# Patient Record
Sex: Female | Born: 1995
Health system: Southern US, Community
[De-identification: ages and names within clinical notes are randomized; demographics above are authoritative.]

---

## 2018-04-01 ENCOUNTER — Ambulatory Visit (INDEPENDENT_AMBULATORY_CARE_PROVIDER_SITE_OTHER): Payer: 59 | Admitting: Family Medicine

## 2018-04-01 ENCOUNTER — Encounter: Payer: Self-pay | Admitting: Family Medicine

## 2018-04-01 VITALS — BP 110/74 | HR 67 | Temp 97.8°F | Ht 60.0 in | Wt 151.2 lb

## 2018-04-01 DIAGNOSIS — Z114 Encounter for screening for human immunodeficiency virus [HIV]: Secondary | ICD-10-CM | POA: Diagnosis not present

## 2018-04-01 DIAGNOSIS — Z0000 Encounter for general adult medical examination without abnormal findings: Secondary | ICD-10-CM

## 2018-04-01 LAB — CBC WITH DIFFERENTIAL/PLATELET
BASOS PCT: 0.5 % (ref 0.0–3.0)
Basophils Absolute: 0 10*3/uL (ref 0.0–0.1)
Eosinophils Absolute: 0.6 10*3/uL (ref 0.0–0.7)
Eosinophils Relative: 6.8 % — ABNORMAL HIGH (ref 0.0–5.0)
HCT: 41.5 % (ref 36.0–46.0)
Hemoglobin: 13.6 g/dL (ref 12.0–15.0)
LYMPHS ABS: 2.5 10*3/uL (ref 0.7–4.0)
Lymphocytes Relative: 26.2 % (ref 12.0–46.0)
MCHC: 32.8 g/dL (ref 30.0–36.0)
MCV: 88.3 fl (ref 78.0–100.0)
MONO ABS: 0.6 10*3/uL (ref 0.1–1.0)
Monocytes Relative: 6.1 % (ref 3.0–12.0)
NEUTROS PCT: 60.4 % (ref 43.0–77.0)
Neutro Abs: 5.6 10*3/uL (ref 1.4–7.7)
PLATELETS: 379 10*3/uL (ref 150.0–400.0)
RBC: 4.7 Mil/uL (ref 3.87–5.11)
RDW: 14.3 % (ref 11.5–15.5)
WBC: 9.3 10*3/uL (ref 4.0–10.5)

## 2018-04-01 LAB — LIPID PANEL
CHOLESTEROL: 178 mg/dL (ref 0–200)
HDL: 35.3 mg/dL — ABNORMAL LOW (ref >39.00)
LDL Cholesterol: 123 mg/dL — ABNORMAL HIGH (ref 0–99)
NonHDL: 143.16
Total CHOL/HDL Ratio: 5
Triglycerides: 103 mg/dL (ref 0.0–149.0)
VLDL: 20.6 mg/dL (ref 0.0–40.0)

## 2018-04-01 LAB — COMPREHENSIVE METABOLIC PANEL
ALK PHOS: 71 U/L (ref 39–117)
ALT: 18 U/L (ref 0–35)
AST: 17 U/L (ref 0–37)
Albumin: 4.4 g/dL (ref 3.5–5.2)
BUN: 10 mg/dL (ref 6–23)
CO2: 29 mEq/L (ref 19–32)
Calcium: 9.3 mg/dL (ref 8.4–10.5)
Chloride: 103 mEq/L (ref 96–112)
Creatinine, Ser: 0.66 mg/dL (ref 0.40–1.20)
GFR: 118.09 mL/min (ref >60.00)
GLUCOSE: 95 mg/dL (ref 70–99)
POTASSIUM: 4.1 meq/L (ref 3.5–5.1)
SODIUM: 140 meq/L (ref 135–145)
TOTAL PROTEIN: 7.2 g/dL (ref 6.0–8.3)
Total Bilirubin: 0.7 mg/dL (ref 0.2–1.2)

## 2018-04-01 LAB — TSH: TSH: 0.87 u[IU]/mL (ref 0.35–4.50)

## 2018-04-01 NOTE — Progress Notes (Signed)
Patient: Katherine Morgan MRN: [id number] DOB: 02-Jun-1996 PCP: Orland Mustard, MD   I acted as a scribe for Dr. Yetta Glassman, LPN  Subjective:  Chief Complaint  Patient presents with  . Establish Care  . Annual Exam    Fasting    HPI: The patient is a 22 y.o. female who presents today for annual exam. She denies any changes to past medical history. There have been no recent hospitalizations. They are following a well balanced diet and exercise plan. Weight has been stable. No complaints today. 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   Immunization History  Administered Date(s) Administered  . Tdap 05/24/2013   Pap smear: n/a. Never had sex.   Review of Systems  Constitutional: Negative for appetite change, chills, fatigue and fever.  HENT: Negative for sinus pain and sore throat.   Eyes: Negative for visual disturbance.  Respiratory: Negative for cough, shortness of breath and wheezing.   Cardiovascular: Negative.   Gastrointestinal: Negative.   Endocrine: Negative for cold intolerance, heat intolerance and polydipsia.  Genitourinary: Negative for decreased urine volume, difficulty urinating, dysuria, flank pain, frequency, genital sores, pelvic pain, urgency, vaginal bleeding, vaginal discharge and vaginal pain.  Musculoskeletal: Negative.   Skin: Negative for rash.  Neurological: Negative for dizziness, tremors, seizures, speech difficulty, weakness, light-headedness, numbness and headaches.  Hematological: Does not bruise/bleed easily.  Psychiatric/Behavioral: Negative.  Negative for agitation, confusion, decreased concentration, hallucinations, sleep disturbance and suicidal ideas. The patient is not nervous/anxious.     Allergies Patient has No Known Allergies.  Past Medical History Patient  has no past medical history on file.  Surgical History Patient  has no past surgical history on file.  Family History Pateint's  family history includes Arthritis in her maternal grandmother and mother; Diabetes in her father and maternal grandfather; Early death in her paternal grandfather; Heart attack in her maternal grandfather; Hyperlipidemia in her father; Hypertension in her maternal grandmother; Seizures in her maternal grandfather; Stroke in her maternal grandfather.  Social History Patient  reports that she has never smoked. She has never used smokeless tobacco. She reports that she drinks alcohol. She reports that she does not use drugs.    Objective: Vitals:   04/01/18 1016  BP: 110/74  Pulse: 67  Temp: 97.8 F (36.6 C)  TempSrc: Oral  SpO2: 97%  Weight: 151 lb 4 oz (68.6 kg)  Height: 5' (1.524 m)    Body mass index is 29.54 kg/m.  Physical Exam  Constitutional: She is oriented to person, place, and time. She appears well-developed and well-nourished.  HENT:  Right Ear: External ear normal.  Left Ear: External ear normal.  Mouth/Throat: Oropharynx is clear and moist.  Eyes: Pupils are equal, round, and reactive to light. Conjunctivae and EOM are normal.  Neck: Normal range of motion. Neck supple. No thyromegaly present.  Cardiovascular: Normal rate, regular rhythm, normal heart sounds and intact distal pulses.  No murmur heard. Pulmonary/Chest: Effort normal and breath sounds normal.  Abdominal: Soft. Bowel sounds are normal. She exhibits no distension. There is no tenderness.  Lymphadenopathy:    She has no cervical adenopathy.  Neurological: She is alert and oriented to person, place, and time. She displays normal reflexes. No cranial nerve deficit. Coordination normal.  Skin: Skin is warm and dry. No rash noted.  Psychiatric: She has a normal mood and affect. Her behavior is normal.  Vitals reviewed.      Assessment/plan: 1.  Annual physical exam She is very healthy and doing great. Continue healthy lifestyle. Continue to follow with dentist. Sunscreen and safety discussed. discussed  counseling below. F/u in one year or as needed. Encouraged to work on weight.  Patient counseling [x]    Nutrition: Stressed importance of moderation in sodium/caffeine intake, saturated fat and cholesterol, caloric balance, sufficient intake of fresh fruits, vegetables, fiber, calcium, iron, and 1 mg of folate supplement per day (for females capable of pregnancy).  [x]    Stressed the importance of regular exercise.   [x]    Substance Abuse: Discussed cessation/primary prevention of tobacco, alcohol, or other drug use; driving or other dangerous activities under the influence; availability of treatment for abuse.   [x]    Injury prevention: Discussed safety belts, safety helmets, smoke detector, smoking near bedding or upholstery.   [x]    Sexuality: Discussed sexually transmitted diseases, partner selection, use of condoms, avoidance of unintended pregnancy  and contraceptive alternatives.  [x]    Dental health: Discussed importance of regular tooth brushing, flossing, and dental visits.  [x]    Health maintenance and immunizations reviewed. Please refer to Health maintenance section.    - Comprehensive metabolic panel - CBC with Differential/Platelet - Lipid panel - TSH  2. Encounter for screening for HIV  - HIV antibody     Return in about 1 year (around 04/02/2019).     Orland Mustard, MD [facility]  04/01/2018

## 2018-04-02 LAB — HIV ANTIBODY (ROUTINE TESTING W REFLEX): HIV: NONREACTIVE

## 2018-04-24 ENCOUNTER — Encounter: Payer: Self-pay | Admitting: Family Medicine

## 2018-04-24 NOTE — Progress Notes (Signed)
Called and left detailed voicemail msg on pt's cell phone.  Advised to call back and schedule appt w/Dr. Artis FlockWolfe (pref Monday, 7/8) to eyeball wound on knee and update tetanus booster at that time.  Advised [facility] can schedule appt for pt.  CRM created

## 2018-04-27 ENCOUNTER — Encounter: Payer: Self-pay | Admitting: Family Medicine

## 2018-04-27 ENCOUNTER — Ambulatory Visit (INDEPENDENT_AMBULATORY_CARE_PROVIDER_SITE_OTHER): Payer: 59 | Admitting: Family Medicine

## 2018-04-27 VITALS — BP 116/82 | HR 70 | Temp 97.9°F | Wt 150.4 lb

## 2018-04-27 DIAGNOSIS — S81019A Laceration without foreign body, unspecified knee, initial encounter: Secondary | ICD-10-CM | POA: Diagnosis not present

## 2018-04-27 DIAGNOSIS — Z23 Encounter for immunization: Secondary | ICD-10-CM

## 2018-04-27 MED ORDER — MUPIROCIN 2 % EX OINT: TOPICAL_OINTMENT | CUTANEOUS | 0 refills | Status: DC

## 2018-04-27 NOTE — Progress Notes (Signed)
Patient: Katherine Morgan MRN: [id number] DOB: 1996-04-08 PCP: Orland MustardWolfe, Evo Aderman, MD     Subjective:  Chief Complaint  Patient presents with   Wound Check    right knee injury 6/22    HPI: The patient is a 22 y.o. female who presents today for right knee laceration that happened on the 6/22. She had taken her glasses on and fell in a pot hole. She dug a rock out on 7/4. She took a shower afterwards, but didn't really flush excessively. It is doing much better. It is healing okay, but she would like me to look at it. No fever/chills, no drainage. No spreading redness and no swelling. She has no pain. She is 5 years into her tetanus shot. I would like to update this.   Review of Systems  Constitutional: Negative for fever.  Musculoskeletal: Negative for arthralgias.  Skin: Positive for wound.    Allergies Patient has No Known Allergies.  Past Medical History Patient  has no past medical history on file.  Surgical History Patient  has no past surgical history on file.  Family History Pateint's family history includes Arthritis in her maternal grandmother and mother; Diabetes in her father and maternal grandfather; Early death in her paternal grandfather; Heart attack in her maternal grandfather; Hyperlipidemia in her father; Hypertension in her maternal grandmother; Seizures in her maternal grandfather; Stroke in her maternal grandfather.  Social History Patient  reports that she has been smoking cigarettes.  She has never used smokeless tobacco. She reports that she drinks alcohol. She reports that she does not use drugs.    Objective: Vitals:   04/27/18 1105  BP: 116/82  Pulse: 70  Temp: 97.9 F (36.6 C)  TempSrc: Oral  SpO2: 99%  Weight: 150 lb 6.4 oz (68.2 kg)    Body mass index is 29.37 kg/m.  Physical Exam  Constitutional: She appears well-developed and well-nourished.  Musculoskeletal: Normal range of motion. She exhibits no edema, tenderness or deformity.   Skin:  Right knee: <605mm open wound on right knee. Healing well. No erythema, edema or discharge.   Vitals reviewed.      Assessment/plan: 1. Laceration of knee, unspecified laterality, initial encounter Close to 5 years on her tdap so will update this. Knee much improved since removing rock. Almost closed. Continue to flush and clean well. bactroban TID. Let me know if any changes.  - Tdap vaccine greater than or equal to 7yo IM     Return if symptoms worsen or fail to improve.   Orland MustardAllison Kynedi Profitt, MD [facility]   04/27/2018

## 2018-05-09 DIAGNOSIS — H5213 Myopia, bilateral: Secondary | ICD-10-CM | POA: Diagnosis not present

## 2019-04-13 DIAGNOSIS — M9901 Segmental and somatic dysfunction of cervical region: Secondary | ICD-10-CM | POA: Diagnosis not present

## 2019-04-13 DIAGNOSIS — M546 Pain in thoracic spine: Secondary | ICD-10-CM | POA: Diagnosis not present

## 2019-04-13 DIAGNOSIS — M9902 Segmental and somatic dysfunction of thoracic region: Secondary | ICD-10-CM | POA: Diagnosis not present

## 2019-04-13 DIAGNOSIS — M542 Cervicalgia: Secondary | ICD-10-CM | POA: Diagnosis not present

## 2019-04-15 DIAGNOSIS — M9902 Segmental and somatic dysfunction of thoracic region: Secondary | ICD-10-CM | POA: Diagnosis not present

## 2019-04-15 DIAGNOSIS — M9901 Segmental and somatic dysfunction of cervical region: Secondary | ICD-10-CM | POA: Diagnosis not present

## 2019-04-15 DIAGNOSIS — M546 Pain in thoracic spine: Secondary | ICD-10-CM | POA: Diagnosis not present

## 2019-04-15 DIAGNOSIS — M542 Cervicalgia: Secondary | ICD-10-CM | POA: Diagnosis not present

## 2019-04-22 DIAGNOSIS — M9901 Segmental and somatic dysfunction of cervical region: Secondary | ICD-10-CM | POA: Diagnosis not present

## 2019-04-22 DIAGNOSIS — M9902 Segmental and somatic dysfunction of thoracic region: Secondary | ICD-10-CM | POA: Diagnosis not present

## 2019-04-22 DIAGNOSIS — M546 Pain in thoracic spine: Secondary | ICD-10-CM | POA: Diagnosis not present

## 2019-04-22 DIAGNOSIS — M542 Cervicalgia: Secondary | ICD-10-CM | POA: Diagnosis not present

## 2019-04-28 DIAGNOSIS — M9901 Segmental and somatic dysfunction of cervical region: Secondary | ICD-10-CM | POA: Diagnosis not present

## 2019-04-28 DIAGNOSIS — M542 Cervicalgia: Secondary | ICD-10-CM | POA: Diagnosis not present

## 2019-04-28 DIAGNOSIS — M9902 Segmental and somatic dysfunction of thoracic region: Secondary | ICD-10-CM | POA: Diagnosis not present

## 2019-04-28 DIAGNOSIS — M546 Pain in thoracic spine: Secondary | ICD-10-CM | POA: Diagnosis not present

## 2020-01-13 ENCOUNTER — Ambulatory Visit (INDEPENDENT_AMBULATORY_CARE_PROVIDER_SITE_OTHER): Payer: No Typology Code available for payment source | Admitting: Family Medicine

## 2020-01-13 ENCOUNTER — Encounter: Payer: Self-pay | Admitting: Family Medicine

## 2020-01-13 ENCOUNTER — Other Ambulatory Visit: Payer: Self-pay

## 2020-01-13 VITALS — BP 100/60 | HR 68 | Temp 98.0°F | Ht 60.0 in | Wt 153.0 lb

## 2020-01-13 DIAGNOSIS — Z0000 Encounter for general adult medical examination without abnormal findings: Secondary | ICD-10-CM | POA: Diagnosis not present

## 2020-01-13 DIAGNOSIS — Z114 Encounter for screening for human immunodeficiency virus [HIV]: Secondary | ICD-10-CM

## 2020-01-13 LAB — COMPREHENSIVE METABOLIC PANEL
ALT: 13 U/L (ref 0–35)
AST: 16 U/L (ref 0–37)
Albumin: 4.7 g/dL (ref 3.5–5.2)
Alkaline Phosphatase: 65 U/L (ref 39–117)
BUN: 13 mg/dL (ref 6–23)
CO2: 28 mEq/L (ref 19–32)
Calcium: 9.7 mg/dL (ref 8.4–10.5)
Chloride: 102 mEq/L (ref 96–112)
Creatinine, Ser: 0.67 mg/dL (ref 0.40–1.20)
GFR: 107.54 mL/min (ref >60.00)
Glucose, Bld: 99 mg/dL (ref 70–99)
Potassium: 4.4 mEq/L (ref 3.5–5.1)
Sodium: 138 mEq/L (ref 135–145)
Total Bilirubin: 0.9 mg/dL (ref 0.2–1.2)
Total Protein: 7.4 g/dL (ref 6.0–8.3)

## 2020-01-13 LAB — CBC WITH DIFFERENTIAL/PLATELET
Basophils Absolute: 0.1 10*3/uL (ref 0.0–0.1)
Basophils Relative: 0.8 % (ref 0.0–3.0)
Eosinophils Absolute: 0.2 10*3/uL (ref 0.0–0.7)
Eosinophils Relative: 2.7 % (ref 0.0–5.0)
HCT: 42.4 % (ref 36.0–46.0)
Hemoglobin: 14.2 g/dL (ref 12.0–15.0)
Lymphocytes Relative: 28.1 % (ref 12.0–46.0)
Lymphs Abs: 2.4 10*3/uL (ref 0.7–4.0)
MCHC: 33.4 g/dL (ref 30.0–36.0)
MCV: 89.4 fl (ref 78.0–100.0)
Monocytes Absolute: 0.5 10*3/uL (ref 0.1–1.0)
Monocytes Relative: 5.5 % (ref 3.0–12.0)
Neutro Abs: 5.3 10*3/uL (ref 1.4–7.7)
Neutrophils Relative %: 62.9 % (ref 43.0–77.0)
Platelets: 373 10*3/uL (ref 150.0–400.0)
RBC: 4.74 Mil/uL (ref 3.87–5.11)
RDW: 13.9 % (ref 11.5–15.5)
WBC: 8.5 10*3/uL (ref 4.0–10.5)

## 2020-01-13 LAB — LIPID PANEL
Cholesterol: 176 mg/dL (ref 0–200)
HDL: 40.4 mg/dL (ref >39.00)
LDL Cholesterol: 120 mg/dL — ABNORMAL HIGH (ref 0–99)
NonHDL: 135.59
Total CHOL/HDL Ratio: 4
Triglycerides: 77 mg/dL (ref 0.0–149.0)
VLDL: 15.4 mg/dL (ref 0.0–40.0)

## 2020-01-13 LAB — VITAMIN D 25 HYDROXY (VIT D DEFICIENCY, FRACTURES): VITD: 25.25 ng/mL — ABNORMAL LOW (ref 30.00–100.00)

## 2020-01-13 LAB — TSH: TSH: 1.06 u[IU]/mL (ref 0.35–4.50)

## 2020-01-13 NOTE — Patient Instructions (Signed)
Preventive Care 25-25 Years Old, Female Preventive care refers to lifestyle choices and visits with your health care provider that can promote health and wellness. At this stage in your life, you may start seeing a primary care physician instead of a pediatrician. Your health care is now your responsibility. Preventive care for young adults includes:  A yearly physical exam. This is also called an annual wellness visit.  Regular dental and eye exams.  Immunizations.  Screening for certain conditions.  Healthy lifestyle choices, such as diet and exercise. What can I expect for my preventive care visit? Physical exam Your health care provider may check:  Height and weight. These may be used to calculate body mass index (BMI), which is a measurement that tells if you are at a healthy weight.  Heart rate and blood pressure.  Body temperature. Counseling Your health care provider may ask you questions about:  Past medical problems and family medical history.  Alcohol, tobacco, and drug use.  Home and relationship well-being.  Access to firearms.  Emotional well-being.  Diet, exercise, and sleep habits.  Sexual activity and sexual health.  Method of birth control.  Menstrual cycle.  Pregnancy history. What immunizations do I need?  Influenza (flu) vaccine  This is recommended every year. Tetanus, diphtheria, and pertussis (Tdap) vaccine  You may need a Td booster every 10 years. Varicella (chickenpox) vaccine  You may need this vaccine if you have not already been vaccinated. Human papillomavirus (HPV) vaccine  If recommended by your health care provider, you may need three doses over 6 months. Measles, mumps, and rubella (MMR) vaccine  You may need at least one dose of MMR. You may also need a second dose. Meningococcal conjugate (MenACWY) vaccine  One dose is recommended if you are 25-25 years old and a first-year college student living in a residence hall,  or if you have one of several medical conditions. You may also need additional booster doses. Pneumococcal conjugate (PCV13) vaccine  You may need this if you have certain conditions and were not previously vaccinated. Pneumococcal polysaccharide (PPSV23) vaccine  You may need one or two doses if you smoke cigarettes or if you have certain conditions. Hepatitis A vaccine  You may need this if you have certain conditions or if you travel or work in places where you may be exposed to hepatitis A. Hepatitis B vaccine  You may need this if you have certain conditions or if you travel or work in places where you may be exposed to hepatitis B. Haemophilus influenzae type b (Hib) vaccine  You may need this if you have certain risk factors. You may receive vaccines as individual doses or as more than one vaccine together in one shot (combination vaccines). Talk with your health care provider about the risks and benefits of combination vaccines. What tests do I need? Blood tests  Lipid and cholesterol levels. These may be checked every 5 years starting at age 25.  Hepatitis C test.  Hepatitis B test. Screening  Pelvic exam and Pap test. This may be done every 3 years starting at age 25.  Sexually transmitted disease (STD) testing, if you are at risk.  BRCA-related cancer screening. This may be done if you have a family history of breast, ovarian, tubal, or peritoneal cancers. Other tests  Tuberculosis skin test.  Vision and hearing tests.  Skin exam.  Breast exam. Follow these instructions at home: Eating and drinking   Eat a diet that includes fresh fruits and   vegetables, whole grains, lean protein, and low-fat dairy products.  Drink enough fluid to keep your urine pale yellow.  Do not drink alcohol if: ? Your health care provider tells you not to drink. ? You are pregnant, may be pregnant, or are planning to become pregnant. ? You are under the legal drinking age. In the  [place] the legal drinking age is 36.  If you drink alcohol: ? Limit how much you have to 0-1 drink a day. ? Be aware of how much alcohol is in your drink. In the [place] one drink equals one 12 oz bottle of beer (355 mL), one 5 oz glass of wine (148 mL), or one 1 oz glass of hard liquor (44 mL). Lifestyle  Take daily care of your teeth and gums.  Stay active. Exercise at least 30 minutes 5 or more days of the week.  Do not use any products that contain nicotine or tobacco, such as cigarettes, e-cigarettes, and chewing tobacco. If you need help quitting, ask your health care provider.  Do not use drugs.  If you are sexually active, practice safe sex. Use a condom or other form of birth control (contraception) in order to prevent pregnancy and STIs (sexually transmitted infections). If you plan to become pregnant, see your health care provider for a pre-conception visit.  Find healthy ways to cope with stress, such as: ? Meditation, yoga, or listening to music. ? Journaling. ? Talking to a trusted person. ? Spending time with friends and family. Safety  Always wear your seat belt while driving or riding in a vehicle.  Do not drive if you have been drinking alcohol. Do not ride with someone who has been drinking.  Do not drive when you are tired or distracted. Do not text while driving.  Wear a helmet and other protective equipment during sports activities.  If you have firearms in your house, make sure you follow all gun safety procedures.  Seek help if you have been bullied, physically abused, or sexually abused.  Use the Internet responsibly to avoid dangers such as online bullying and online sex predators. What's next?  Go to your health care provider once a year for a well check visit.  Ask your health care provider how often you should have your eyes and teeth checked.  Stay up to date on all vaccines. This information is not intended to replace advice given to you by  your health care provider. Make sure you discuss any questions you have with your health care provider. Document Revised: 10/01/2018 Document Reviewed: 10/01/2018 Elsevier Patient Education  2020 [employer].

## 2020-01-13 NOTE — Progress Notes (Signed)
Patient: Katherine Morgan MRN: [id number] DOB: 13-Dec-1995 PCP: Orma Flaming, MD     Subjective:  Chief Complaint  Patient presents with  . Annual Exam    HPI: The patient is a 24 y.o. female who presents today for annual exam. She denies any changes to past medical history. There have been no recent hospitalizations. They are following a well balanced diet and exercise plan. Weight has been stable. No complaints today.   Immunization History  Administered Date(s) Administered  . DTaP 07/11/1995, 10/03/1995, 11/21/1995  . Hepatitis B 04-10-1995, 07/11/1995, 05/21/1996  . HiB (PRP-OMP) 07/11/1995, 10/03/1995, 11/21/1995  . IPV 07/11/1995, 10/03/1995, 11/21/1995  . Influenza, Quadrivalent, Recombinant, Inj, Pf 07/22/2019  . MMR 05/21/1996  . PFIZER SARS-COV-2 Vaccination 11/15/2019, 12/10/2019  . Tdap 05/24/2013, 04/27/2018  . Varicella 05/21/1996   Colonoscopy: routine screening.  Mammogram: routine screening  Pap smear: n/a. Never had sex.   Review of Systems  Constitutional: Negative for chills, fatigue and fever.  HENT: Negative for dental problem, ear pain, hearing loss, sinus pressure, sinus pain, sore throat and trouble swallowing.   Eyes: Negative for visual disturbance.  Respiratory: Negative for cough, chest tightness, shortness of breath and wheezing.   Cardiovascular: Negative for chest pain, palpitations and leg swelling.  Gastrointestinal: Negative for abdominal pain, blood in stool, diarrhea, nausea and vomiting.  Endocrine: Negative for cold intolerance, polydipsia, polyphagia and polyuria.  Genitourinary: Negative for dysuria, frequency, hematuria, pelvic pain and urgency.  Musculoskeletal: Negative for arthralgias.  Skin: Negative for rash.  Neurological: Negative for dizziness and headaches.  Psychiatric/Behavioral: Negative for dysphoric mood and sleep disturbance. The patient is not nervous/anxious.     Allergies Patient has No Known  Allergies.  Past Medical History Patient  has no past medical history on file.  Surgical History Patient  has no past surgical history on file.  Family History Pateint's family history includes Arthritis in her maternal grandmother and mother; Diabetes in her father and maternal grandfather; Early death in her paternal grandfather; Heart attack in her maternal grandfather; Hyperlipidemia in her father; Hypertension in her maternal grandmother; Seizures in her maternal grandfather; Stroke in her maternal grandfather.  Social History Patient  reports that she has been smoking cigarettes. She has never used smokeless tobacco. She reports current alcohol use. She reports that she does not use drugs.    Objective: Vitals:   01/13/20 1037  BP: 100/60  Pulse: 68  Temp: 98 F (36.7 C)  TempSrc: Temporal  SpO2: 99%  Weight: 153 lb (69.4 kg)  Height: 5' (1.524 m)    Body mass index is 29.88 kg/m.  Physical Exam Vitals reviewed.  Constitutional:      Appearance: Normal appearance. She is well-developed.  HENT:     Head: Normocephalic and atraumatic.     Right Ear: Tympanic membrane, ear canal and external ear normal.     Left Ear: Tympanic membrane, ear canal and external ear normal.     Mouth/Throat:     Mouth: Mucous membranes are moist.  Eyes:     Extraocular Movements: Extraocular movements intact.     Conjunctiva/sclera: Conjunctivae normal.     Pupils: Pupils are equal, round, and reactive to light.  Neck:     Thyroid: No thyromegaly.  Cardiovascular:     Rate and Rhythm: Normal rate and regular rhythm.     Pulses: Normal pulses.     Heart sounds: Normal heart sounds. No murmur.  Pulmonary:     Effort: Pulmonary effort is normal.  Breath sounds: Normal breath sounds.  Abdominal:     General: Bowel sounds are normal. There is no distension.     Palpations: Abdomen is soft.     Tenderness: There is no abdominal tenderness.  Musculoskeletal:     Cervical back:  Normal range of motion and neck supple.  Lymphadenopathy:     Cervical: No cervical adenopathy.  Skin:    General: Skin is warm and dry.     Capillary Refill: Capillary refill takes less than 2 seconds.     Findings: No rash.     Comments: Acanthosis nigricans around posterior and lateral neck   Neurological:     General: No focal deficit present.     Mental Status: She is alert and oriented to person, place, and time.     Cranial Nerves: No cranial nerve deficit.     Coordination: Coordination normal.     Deep Tendon Reflexes: Reflexes normal.  Psychiatric:        Mood and Affect: Mood normal.        Behavior: Behavior normal.      Office Visit from 04/01/2018 in [facility]  PHQ-2 Total Score  0     Phq9:score of 1     Assessment/plan: 1. Annual physical exam Fasting labs today. Doing well. Encouraged some weight loss. utd on HM. F/u in one year or as needed.  Patient counseling '[x]'$    Nutrition: Stressed importance of moderation in sodium/caffeine intake, saturated fat and cholesterol, caloric balance, sufficient intake of fresh fruits, vegetables, fiber, calcium, iron, and 1 mg of folate supplement per day (for females capable of pregnancy).  '[x]'$    Stressed the importance of regular exercise.   '[]'$    Substance Abuse: Discussed cessation/primary prevention of tobacco, alcohol, or other drug use; driving or other dangerous activities under the influence; availability of treatment for abuse.   '[x]'$    Injury prevention: Discussed safety belts, safety helmets, smoke detector, smoking near bedding or upholstery.   '[x]'$    Sexuality: Discussed sexually transmitted diseases, partner selection, use of condoms, avoidance of unintended pregnancy  and contraceptive alternatives.  '[x]'$    Dental health: Discussed importance of regular tooth brushing, flossing, and dental visits.  '[x]'$    Health maintenance and immunizations reviewed. Please refer to Health maintenance  section.    - CBC with Differential/Platelet - Comprehensive metabolic panel - Lipid panel - TSH - VITAMIN D 25 Hydroxy (Vit-D Deficiency, Fractures)  This visit occurred during the SARS-CoV-2 public health emergency.  Safety protocols were in place, including screening questions prior to the visit, additional usage of staff PPE, and extensive cleaning of exam room while observing appropriate contact time as indicated for disinfecting solutions.    Return in about 1 year (around 01/12/2021).     Orma Flaming, MD [facility]  01/13/2020

## 2020-01-14 ENCOUNTER — Encounter: Payer: Self-pay | Admitting: Family Medicine

## 2020-01-14 ENCOUNTER — Other Ambulatory Visit: Payer: Self-pay | Admitting: Family Medicine

## 2020-01-14 DIAGNOSIS — E559 Vitamin D deficiency, unspecified: Secondary | ICD-10-CM

## 2020-01-14 LAB — HIV ANTIBODY (ROUTINE TESTING W REFLEX): HIV 1&2 Ab, 4th Generation: NONREACTIVE

## 2020-01-14 MED ORDER — VITAMIN D (ERGOCALCIFEROL) 1.25 MG (50000 UNIT) PO CAPS: ORAL_CAPSULE | ORAL | 0 refills | Status: AC

## 2020-04-11 ENCOUNTER — Encounter: Payer: Self-pay | Admitting: Family Medicine

## 2020-04-11 ENCOUNTER — Telehealth: Payer: Self-pay | Admitting: Family Medicine

## 2020-04-11 NOTE — Telephone Encounter (Signed)
I spoke with pt to confirm Immunization records can be picked up once paperwork is ready. Pt verbalized understanding.

## 2020-04-11 NOTE — Telephone Encounter (Signed)
Pt called asking for a copy of her immunization records. Please advise.

## 2020-04-13 ENCOUNTER — Emergency Department
Admission: EM | Admit: 2020-04-13 | Discharge: 2020-04-13 | Disposition: A | Discharge status: Home / Self Care | Payer: No Typology Code available for payment source | Attending: Emergency Medicine | Admitting: Emergency Medicine

## 2020-04-13 ENCOUNTER — Encounter: Payer: Self-pay | Admitting: Emergency Medicine

## 2020-04-13 ENCOUNTER — Other Ambulatory Visit: Payer: Self-pay

## 2020-04-13 ENCOUNTER — Emergency Department: Payer: No Typology Code available for payment source

## 2020-04-13 DIAGNOSIS — M79642 Pain in left hand: Secondary | ICD-10-CM | POA: Insufficient documentation

## 2020-04-13 DIAGNOSIS — M25562 Pain in left knee: Secondary | ICD-10-CM | POA: Insufficient documentation

## 2020-04-13 DIAGNOSIS — Y9389 Activity, other specified: Secondary | ICD-10-CM | POA: Insufficient documentation

## 2020-04-13 DIAGNOSIS — Y92410 Unspecified street and highway as the place of occurrence of the external cause: Secondary | ICD-10-CM | POA: Insufficient documentation

## 2020-04-13 DIAGNOSIS — R0789 Other chest pain: Secondary | ICD-10-CM

## 2020-04-13 DIAGNOSIS — Y999 Unspecified external cause status: Secondary | ICD-10-CM | POA: Insufficient documentation

## 2020-04-13 DIAGNOSIS — M25561 Pain in right knee: Secondary | ICD-10-CM | POA: Insufficient documentation

## 2020-04-13 DIAGNOSIS — F17210 Nicotine dependence, cigarettes, uncomplicated: Secondary | ICD-10-CM | POA: Insufficient documentation

## 2020-04-13 DIAGNOSIS — R072 Precordial pain: Secondary | ICD-10-CM | POA: Diagnosis not present

## 2020-04-13 MED ORDER — CYCLOBENZAPRINE HCL 5 MG PO TABS: 5.0000 mg | ORAL_TABLET | Freq: Three times a day (TID) | ORAL | 0 refills | Status: DC | PRN

## 2020-04-13 MED ORDER — NAPROXEN 375 MG PO TABS: 375.0000 mg | ORAL_TABLET | Freq: Two times a day (BID) | ORAL | 0 refills | Status: DC

## 2020-04-13 MED ORDER — TRAMADOL HCL 50 MG PO TABS
50.0000 mg | ORAL_TABLET | Freq: Once | ORAL | Status: AC
  Administered 2020-04-13: 50 mg via ORAL
  Filled 2020-04-13: qty 1
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
## 2020-07-04 ENCOUNTER — Encounter: Payer: Self-pay | Admitting: Family Medicine

## 2020-07-04 NOTE — Telephone Encounter (Signed)
Called patient but was not a good time to speak with her

## 2020-07-04 NOTE — Telephone Encounter (Signed)
Please schedule appointment with the patient.  Thank You

## 2020-07-28 ENCOUNTER — Other Ambulatory Visit: Payer: Self-pay

## 2020-07-28 ENCOUNTER — Ambulatory Visit (INDEPENDENT_AMBULATORY_CARE_PROVIDER_SITE_OTHER): Payer: 59 | Admitting: Family Medicine

## 2020-07-28 ENCOUNTER — Encounter: Payer: Self-pay | Admitting: Family Medicine

## 2020-07-28 VITALS — BP 122/80 | HR 68 | Temp 98.0°F | Ht 60.0 in | Wt 153.8 lb

## 2020-07-28 DIAGNOSIS — R2232 Localized swelling, mass and lump, left upper limb: Secondary | ICD-10-CM | POA: Diagnosis not present

## 2020-07-28 MED ORDER — CLINDAMYCIN PHOSPHATE 1 % EX SOLN: Freq: Two times a day (BID) | CUTANEOUS | 1 refills | Status: DC

## 2020-07-28 NOTE — Progress Notes (Signed)
Patient: Katherine Morgan MRN: [id number] DOB: 27-Jan-1996 PCP: Orland Mustard, MD     Subjective:  Chief Complaint  Patient presents with  . Arm Pain    Left    HPI: The patient is a 24 y.o. female who presents today for left arm pain and lesion. Started in august as a little pimple, but then kept growing ot nickle size. She pressed on it and it drained puss and blood x 1 week. It was painful and then went away. It came back in same location and it was smaller and didn't drain as much in September. She used hydrocortisone cream, and ibuprofen. It has began to come back. She can feel a bump there, but no draining lesion at this time. No lesions in other axillae, under breasts ,but does have bump in groin, but is almost gone.   No fever/chills, no lumps in breast.   Review of Systems  Constitutional: Negative for chills, fatigue and fever.  HENT: Negative for dental problem, ear pain, hearing loss and trouble swallowing.   Eyes: Negative for visual disturbance.  Respiratory: Negative for cough, chest tightness and shortness of breath.   Cardiovascular: Negative for chest pain, palpitations and leg swelling.  Gastrointestinal: Negative for abdominal pain, blood in stool, diarrhea and nausea.  Endocrine: Negative for cold intolerance, polydipsia, polyphagia and polyuria.  Genitourinary: Negative for dysuria and hematuria.  Musculoskeletal: Negative for arthralgias and joint swelling.  Skin: Negative for rash.  Neurological: Negative for dizziness and headaches.  Psychiatric/Behavioral: Negative for dysphoric mood and sleep disturbance. The patient is not nervous/anxious.     Allergies Patient has No Known Allergies.  Past Medical History Patient  has no past medical history on file.  Surgical History Patient  has no past surgical history on file.  Family History Pateint's family history includes Arthritis in her maternal grandmother and mother; Diabetes in her father and  maternal grandfather; Early death in her paternal grandfather; Heart attack in her maternal grandfather; Hyperlipidemia in her father; Hypertension in her maternal grandmother; Seizures in her maternal grandfather; Stroke in her maternal grandfather.  Social History Patient  reports that she has been smoking cigarettes. She has never used smokeless tobacco. She reports current alcohol use. She reports that she does not use drugs.    Objective: Vitals:   07/28/20 1349  BP: 122/80  Pulse: 68  Temp: 98 F (36.7 C)  TempSrc: Temporal  SpO2: 97%  Weight: 153 lb 12.8 oz (69.8 kg)  Height: 5' (1.524 m)    Body mass index is 30.04 kg/m.  Physical Exam Vitals reviewed.  Constitutional:      Appearance: Normal appearance. She is obese.  HENT:     Head: Normocephalic and atraumatic.  Pulmonary:     Effort: Pulmonary effort is normal.  Skin:    Comments: Left axillary: palpable, very small mobile pebble lesion that is very superficial. No pain, edema, erythema. Not fixed or large.    Neurological:     Mental Status: She is alert.        Assessment/plan: 1. Mass of left axilla Scar tissue vs. hydradentitis suppurativa vs. Calcification. Does not feel pathologic at all. Giving clindamycin gel to put on topically and if it starts to grow or bother her she is to let me know asap so we can image. She will make sure and monitor.    This visit occurred during the SARS-CoV-2 public health emergency.  Safety protocols were in place, including screening questions prior to the  visit, additional usage of staff PPE, and extensive cleaning of exam room while observing appropriate contact time as indicated for disinfecting solutions.     Return if symptoms worsen or fail to improve.   Orland Mustard, MD [facility]   07/28/2020

## 2020-07-28 NOTE — Patient Instructions (Signed)
Feels like scar tissue from abscess. Another thing I worry about is hydradenitis suppurativa. Looks this up.   I need to know if you feel like it's growing because I need to ultrasound, so just email me!   So good seeing you!  Dr. Artis Flock

## 2020-09-29 ENCOUNTER — Encounter: Payer: Self-pay | Admitting: Family Medicine

## 2020-11-03 ENCOUNTER — Other Ambulatory Visit: Payer: 59

## 2020-11-03 DIAGNOSIS — Z20822 Contact with and (suspected) exposure to covid-19: Secondary | ICD-10-CM

## 2020-11-07 LAB — NOVEL CORONAVIRUS, NAA: SARS-CoV-2, NAA: DETECTED — AB

## 2021-05-17 ENCOUNTER — Other Ambulatory Visit: Payer: Self-pay

## 2021-05-17 ENCOUNTER — Ambulatory Visit: Payer: 59 | Admitting: Physician Assistant

## 2021-05-17 ENCOUNTER — Encounter: Payer: Self-pay | Admitting: Physician Assistant

## 2021-05-17 VITALS — BP 104/70 | HR 66 | Temp 97.2°F | Ht 60.0 in | Wt 153.2 lb

## 2021-05-17 DIAGNOSIS — S00411A Abrasion of right ear, initial encounter: Secondary | ICD-10-CM | POA: Diagnosis not present

## 2021-05-17 NOTE — Progress Notes (Signed)
New Patient Office Visit  Subjective:  Patient ID: Katherine Morgan, female    DOB: [birth year]  Age: 25 y.o. MRN: [id number]  CC:  Chief Complaint  Patient presents with   Ear Pain    Right ear    HPI [name] presents for right ear pain x 1 week. States it started after she popped it in the shower. Two days ago put a Q-tip in her ear and noticed some blood. Pain is less today, but wanted it checked out.   History reviewed. No pertinent past medical history.  History reviewed. No pertinent surgical history.  Family History  Problem Relation Age of Onset   Arthritis Mother    Diabetes Father    Hyperlipidemia Father    Hypertension Maternal Grandmother    Arthritis Maternal Grandmother    Diabetes Maternal Grandfather    Stroke Maternal Grandfather    Seizures Maternal Grandfather    Heart attack Maternal Grandfather    Early death Paternal Grandfather     Social History   Socioeconomic History   Marital status: Single    Spouse name: Not on file   Number of children: Not on file   Years of education: Not on file   Highest education level: Not on file  Occupational History   Not on file  Tobacco Use   Smoking status: Light Smoker    Types: Cigarettes   Smokeless tobacco: Never  Vaping Use   Vaping Use: Never used  Substance and Sexual Activity   Alcohol use: Yes    Comment: Occassionally   Drug use: Never   Sexual activity: Never  Other Topics Concern   Not on file  Social History Narrative   Not on file   Social Determinants of Health   Financial Resource Strain: Not on file  Food Insecurity: Not on file  Transportation Needs: Not on file  Physical Activity: Not on file  Stress: Not on file  Social Connections: Not on file  Intimate Partner Violence: Not on file    ROS Review of Systems Right ear: No discharge. No tinnitus. No hearing loss. Slight pain is better. Bleeding has resolved.   Objective:   Today's Vitals: BP 104/70    Pulse 66   Temp (!) 97.2 F (36.2 C)   Ht 5' (1.524 m)   Wt 153 lb 3.2 oz (69.5 kg)   SpO2 100%   BMI 29.92 kg/m   Physical Exam Vitals and nursing note reviewed.  Constitutional:      Appearance: Normal appearance.  HENT:     Right Ear: Tympanic membrane, ear canal and external ear normal. There is no impacted cerumen.     Left Ear: Tympanic membrane, ear canal and external ear normal. There is no impacted cerumen.  Neurological:     Mental Status: She is alert.    Assessment & Plan:   Problem List Items Addressed This Visit   None   Outpatient Encounter Medications as of 05/17/2021  Medication Sig   acetaminophen (TYLENOL) 500 MG tablet Take 500 mg by mouth every 6 (six) hours as needed.   loratadine (CLARITIN) 10 MG tablet Take 10 mg by mouth daily as needed for allergies.   naphazoline-glycerin (CLEAR EYES REDNESS) 0.012-0.2 % SOLN 1-2 drops 4 (four) times daily as needed for eye irritation.   Vitamin D, Ergocalciferol, (DRISDOL) 1.25 MG (50000 UNIT) CAPS capsule One capsule by mouth once a week for 8 weeks. Then take 1000IU/day   [DISCONTINUED] clindamycin (  CLEOCIN-T) 1 % external solution Apply topically 2 (two) times daily.   [DISCONTINUED] naproxen (NAPROSYN) 375 MG tablet Take 1 tablet (375 mg total) by mouth 2 (two) times daily with a meal.   [DISCONTINUED] cyclobenzaprine (FLEXERIL) 5 MG tablet Take 1 tablet (5 mg total) by mouth 3 (three) times daily as needed for muscle spasms. (Patient not taking: Reported on 07/28/2020)   No facility-administered encounter medications on file as of 05/17/2021.    Follow-up: No follow-ups on file.   1. Abrasion of right ear canal, initial encounter Most likely had abrasion of ear canal, but it looks completely normal today. Reassured patient. No infection. Reminded not to use Q-tips.    Arun Herrod M Brylen Wagar, PA-C

## 2021-10-15 ENCOUNTER — Telehealth: Payer: 59 | Admitting: Family

## 2021-10-15 DIAGNOSIS — R399 Unspecified symptoms and signs involving the genitourinary system: Secondary | ICD-10-CM | POA: Diagnosis not present

## 2021-10-15 MED ORDER — CEPHALEXIN 500 MG PO CAPS: 500.0000 mg | ORAL_CAPSULE | Freq: Two times a day (BID) | ORAL | 0 refills | Status: DC

## 2021-10-15 NOTE — Progress Notes (Signed)

## 2021-11-02 ENCOUNTER — Ambulatory Visit: Payer: 59 | Admitting: Physician Assistant

## 2021-11-02 ENCOUNTER — Other Ambulatory Visit: Payer: Self-pay

## 2021-11-02 ENCOUNTER — Encounter: Payer: Self-pay | Admitting: Physician Assistant

## 2021-11-02 ENCOUNTER — Other Ambulatory Visit (HOSPITAL_COMMUNITY)
Admission: RE | Admit: 2021-11-02 | Discharge: 2021-11-02 | Disposition: A | Discharge status: Home / Self Care | Payer: 59 | Source: Ambulatory Visit | Attending: Physician Assistant | Admitting: Physician Assistant

## 2021-11-02 VITALS — BP 118/84 | HR 76 | Temp 97.8°F | Ht 60.0 in | Wt 156.2 lb

## 2021-11-02 DIAGNOSIS — J029 Acute pharyngitis, unspecified: Secondary | ICD-10-CM

## 2021-11-02 DIAGNOSIS — Z202 Contact with and (suspected) exposure to infections with a predominantly sexual mode of transmission: Secondary | ICD-10-CM | POA: Insufficient documentation

## 2021-11-02 DIAGNOSIS — R1013 Epigastric pain: Secondary | ICD-10-CM | POA: Diagnosis not present

## 2021-11-02 DIAGNOSIS — R590 Localized enlarged lymph nodes: Secondary | ICD-10-CM | POA: Diagnosis not present

## 2021-11-02 NOTE — Patient Instructions (Signed)
Good to see you today! Please go to the lab for blood work and I will send results through MyChart. Pending labs will guide treatment. Continue on Prilosec. Consider US abdomen. Stick to bland diet for the next few days.

## 2021-11-02 NOTE — Progress Notes (Signed)
Subjective:    Patient ID: Ramiro HarvestGladys A Negash, female    DOB: [birth year], 26 y.o.   MRN: [id number]  Chief Complaint  Patient presents with   Abdominal Pain   Headache          HPI Patient is in today for abdominal pain and headache.  10/15/21 - E-visit thought she had UTI, bladder spasm, full course cephalexin completed. UTI symptoms resolved.  Then period started and went away.  Epigastric pain, Worse with eating. Some radiation into the back. The had R neck lymph node swollen up. Intermittent headaches.   Went to urgent care on 10/30/21 - not many options available and told to go to ED, but pt didn't want to go. Prilosec was started and she is feeling some better.   Lymph node swelling is better. Headaches are better. No fevers. No body aches.   Tylenol often. She was told to not take ibuprofen in case she had an ulcer.  No recent sick contacts. Not eating as much currently because still having some epigastric pain.  No past medical history on file.  No past surgical history on file.  Family History  Problem Relation Age of Onset   Arthritis Mother    Diabetes Father    Hyperlipidemia Father    Hypertension Maternal Grandmother    Arthritis Maternal Grandmother    Diabetes Maternal Grandfather    Stroke Maternal Grandfather    Seizures Maternal Grandfather    Heart attack Maternal Grandfather    Early death Paternal Grandfather     Social History   Tobacco Use   Smoking status: Light Smoker    Types: Cigarettes   Smokeless tobacco: Never  Vaping Use   Vaping Use: Never used  Substance Use Topics   Alcohol use: Yes    Comment: Occassionally   Drug use: Never     No Known Allergies  Review of Systems NEGATIVE UNLESS OTHERWISE INDICATED IN HPI      Objective:     BP 118/84    Pulse 76    Temp 97.8 F (36.6 C)    Ht 5' (1.524 m)    Wt 156 lb 3.2 oz (70.9 kg)    LMP 10/23/2021    SpO2 98%    BMI 30.51 kg/m   Wt Readings from Last 3 Encounters:   11/02/21 156 lb 3.2 oz (70.9 kg)  05/17/21 153 lb 3.2 oz (69.5 kg)  07/28/20 153 lb 12.8 oz (69.8 kg)    BP Readings from Last 3 Encounters:  11/02/21 118/84  05/17/21 104/70  07/28/20 122/80     Physical Exam Vitals and nursing note reviewed.  Constitutional:      Appearance: Normal appearance. She is normal weight. She is not toxic-appearing.  HENT:     Head: Normocephalic and atraumatic.     Right Ear: Tympanic membrane, ear canal and external ear normal.     Left Ear: Tympanic membrane, ear canal and external ear normal.     Nose: Nose normal.     Mouth/Throat:     Mouth: Mucous membranes are moist.  Eyes:     Extraocular Movements: Extraocular movements intact.     Conjunctiva/sclera: Conjunctivae normal.     Pupils: Pupils are equal, round, and reactive to light.  Cardiovascular:     Rate and Rhythm: Normal rate and regular rhythm.     Pulses: Normal pulses.     Heart sounds: Normal heart sounds.  Pulmonary:     Effort: Pulmonary  effort is normal.     Breath sounds: Normal breath sounds.  Abdominal:     General: Abdomen is flat. Bowel sounds are normal.     Palpations: Abdomen is soft.     Tenderness: There is abdominal tenderness in the epigastric area. There is no right CVA tenderness, left CVA tenderness or guarding. Negative signs include Murphy's sign and McBurney's sign.  Musculoskeletal:        General: Normal range of motion.     Cervical back: Normal range of motion and neck supple.  Lymphadenopathy:     Head:     Right side of head: No submandibular or tonsillar adenopathy.     Left side of head: No submandibular or tonsillar adenopathy.     Cervical: Cervical adenopathy present.     Right cervical: Posterior cervical adenopathy (one notable mobile lymph node, some TTP) present.     Upper Body:     Right upper body: No supraclavicular adenopathy.     Left upper body: No supraclavicular adenopathy.  Skin:    General: Skin is warm and dry.   Neurological:     General: No focal deficit present.     Mental Status: She is alert and oriented to person, place, and time.  Psychiatric:        Mood and Affect: Mood normal.        Behavior: Behavior normal.        Thought Content: Thought content normal.        Judgment: Judgment normal.       Assessment & Plan:   Problem List Items Addressed This Visit   None Visit Diagnoses     Epigastric pain    -  Primary   Relevant Orders   Epstein-Barr virus VCA antibody panel   H. pylori breath test   Comprehensive metabolic panel (Completed)   Lipase   CBC with Differential/Platelet (Completed)   Lymphadenopathy, cervical       Relevant Orders   Epstein-Barr virus VCA antibody panel   H. pylori breath test   Comprehensive metabolic panel (Completed)   Lipase   CBC with Differential/Platelet (Completed)   Acute pharyngitis, unspecified etiology       Relevant Orders   Epstein-Barr virus VCA antibody panel   H. pylori breath test   Comprehensive metabolic panel (Completed)   Lipase   CBC with Differential/Platelet (Completed)   Exposure to sexually transmitted disease (STD)       Relevant Orders   HIV antibody (with reflex)   Urine cytology ancillary only([place])   RPR (Completed)       Plan: -I personally reviewed urgent care report from 10/30/21. Prilosec they started her on does seem to be helping some, so we will continue this for now. -Conglomeration of symptoms continues. Possibly viral illness. Also need to consider mononucleosis, gastric ulcer, pancreatitis, GBD, H.pylori, to name a few in differential. -No red flags on exam so I don't think we need STAT imaging. May need to consider US abdomen. -Will definitely check labs today -BRATY diet -ED if acutely worse   This note was prepared with assistance of Dragon voice recognition software. Occasional wrong-word or sound-a-like substitutions may have occurred due to the inherent limitations of voice  recognition software.  Time Spent: 38 minutes of total time was spent on the date of the encounter performing the following actions: chart review prior to seeing the patient, obtaining history, performing a medically necessary exam, counseling on the treatment plan, placing  orders, and documenting in our EHR.    Dimond Crotty M Rodel Glaspy, PA-C

## 2021-11-03 LAB — RPR: RPR Ser Ql: NONREACTIVE

## 2021-11-03 LAB — COMPREHENSIVE METABOLIC PANEL
AG Ratio: 1.1 (calc) (ref 1.0–2.5)
ALT: 369 U/L — ABNORMAL HIGH (ref 6–29)
AST: 148 U/L — ABNORMAL HIGH (ref 10–30)
Albumin: 4.2 g/dL (ref 3.6–5.1)
Alkaline phosphatase (APISO): 218 U/L — ABNORMAL HIGH (ref 31–125)
BUN/Creatinine Ratio: 10 (calc) (ref 6–22)
BUN: 6 mg/dL — ABNORMAL LOW (ref 7–25)
CO2: 26 mmol/L (ref 20–32)
Calcium: 9.3 mg/dL (ref 8.6–10.2)
Chloride: 104 mmol/L (ref 98–110)
Creat: 0.6 mg/dL (ref 0.50–0.96)
Globulin: 3.7 g/dL (calc) (ref 1.9–3.7)
Glucose, Bld: 102 mg/dL — ABNORMAL HIGH (ref 65–99)
Potassium: 4.5 mmol/L (ref 3.5–5.3)
Sodium: 141 mmol/L (ref 135–146)
Total Bilirubin: 0.6 mg/dL (ref 0.2–1.2)
Total Protein: 7.9 g/dL (ref 6.1–8.1)

## 2021-11-03 LAB — CBC WITH DIFFERENTIAL/PLATELET
Absolute Monocytes: 627 cells/uL (ref 200–950)
Basophils Absolute: 143 cells/uL (ref 0–200)
Basophils Relative: 1.5 %
Eosinophils Absolute: 19 cells/uL (ref 15–500)
Eosinophils Relative: 0.2 %
HCT: 42.6 % (ref 35.0–45.0)
Hemoglobin: 13.9 g/dL (ref 11.7–15.5)
Lymphs Abs: 6812 cells/uL — ABNORMAL HIGH (ref 850–3900)
MCH: 29 pg (ref 27.0–33.0)
MCHC: 32.6 g/dL (ref 32.0–36.0)
MCV: 88.9 fL (ref 80.0–100.0)
MPV: 10.4 fL (ref 7.5–12.5)
Monocytes Relative: 6.6 %
Neutro Abs: 1900 cells/uL (ref 1500–7800)
Neutrophils Relative %: 20 %
Platelets: 340 10*3/uL (ref 140–400)
RBC: 4.79 10*6/uL (ref 3.80–5.10)
RDW: 13.3 % (ref 11.0–15.0)
Total Lymphocyte: 71.7 %
WBC: 9.5 10*3/uL (ref 3.8–10.8)

## 2021-11-05 ENCOUNTER — Ambulatory Visit: Payer: 59 | Admitting: Physician Assistant

## 2021-11-05 ENCOUNTER — Other Ambulatory Visit: Payer: Self-pay | Admitting: Physician Assistant

## 2021-11-05 ENCOUNTER — Other Ambulatory Visit: Payer: 59

## 2021-11-05 DIAGNOSIS — R1013 Epigastric pain: Secondary | ICD-10-CM

## 2021-11-05 DIAGNOSIS — B2700 Gammaherpesviral mononucleosis without complication: Secondary | ICD-10-CM

## 2021-11-05 DIAGNOSIS — R748 Abnormal levels of other serum enzymes: Secondary | ICD-10-CM

## 2021-11-05 LAB — H. PYLORI BREATH TEST: H. pylori Breath Test: NOT DETECTED

## 2021-11-05 LAB — URINE CYTOLOGY ANCILLARY ONLY
Chlamydia: NEGATIVE
Comment: NEGATIVE
Comment: NORMAL
Neisseria Gonorrhea: NEGATIVE

## 2021-11-05 LAB — RPR: RPR Ser Ql: NONREACTIVE

## 2021-11-05 LAB — HIV ANTIBODY (ROUTINE TESTING W REFLEX): HIV 1&2 Ab, 4th Generation: NONREACTIVE

## 2021-11-05 LAB — EPSTEIN-BARR VIRUS VCA ANTIBODY PANEL
EBV NA IgG: <18 U/mL
EBV VCA IgG: 94.9 U/mL — ABNORMAL HIGH
EBV VCA IgM: >160 U/mL — ABNORMAL HIGH

## 2021-11-06 ENCOUNTER — Other Ambulatory Visit: Payer: Self-pay

## 2021-11-06 ENCOUNTER — Other Ambulatory Visit (INDEPENDENT_AMBULATORY_CARE_PROVIDER_SITE_OTHER): Payer: 59

## 2021-11-06 ENCOUNTER — Encounter: Payer: Self-pay | Admitting: Physician Assistant

## 2021-11-06 DIAGNOSIS — R748 Abnormal levels of other serum enzymes: Secondary | ICD-10-CM | POA: Diagnosis not present

## 2021-11-06 DIAGNOSIS — B2700 Gammaherpesviral mononucleosis without complication: Secondary | ICD-10-CM

## 2021-11-06 DIAGNOSIS — R1013 Epigastric pain: Secondary | ICD-10-CM | POA: Diagnosis not present

## 2021-11-06 LAB — COMPREHENSIVE METABOLIC PANEL
ALT: 143 U/L — ABNORMAL HIGH (ref 0–35)
AST: 48 U/L — ABNORMAL HIGH (ref 0–37)
Albumin: 4.3 g/dL (ref 3.5–5.2)
Alkaline Phosphatase: 142 U/L — ABNORMAL HIGH (ref 39–117)
BUN: 11 mg/dL (ref 6–23)
CO2: 29 mEq/L (ref 19–32)
Calcium: 9.4 mg/dL (ref 8.4–10.5)
Chloride: 103 mEq/L (ref 96–112)
Creatinine, Ser: 0.67 mg/dL (ref 0.40–1.20)
GFR: 120.58 mL/min (ref >60.00)
Glucose, Bld: 100 mg/dL — ABNORMAL HIGH (ref 70–99)
Potassium: 4.1 mEq/L (ref 3.5–5.1)
Sodium: 139 mEq/L (ref 135–145)
Total Bilirubin: 0.7 mg/dL (ref 0.2–1.2)
Total Protein: 8.1 g/dL (ref 6.0–8.3)

## 2021-11-06 LAB — CBC WITH DIFFERENTIAL/PLATELET
Basophils Absolute: 0 10*3/uL (ref 0.0–0.1)
Basophils Relative: 0.5 % (ref 0.0–3.0)
Eosinophils Absolute: 0.1 10*3/uL (ref 0.0–0.7)
Eosinophils Relative: 1.7 % (ref 0.0–5.0)
HCT: 44.2 % (ref 36.0–46.0)
Hemoglobin: 14.4 g/dL (ref 12.0–15.0)
Lymphocytes Relative: 53.6 % — ABNORMAL HIGH (ref 12.0–46.0)
Lymphs Abs: 2.9 10*3/uL (ref 0.7–4.0)
MCHC: 32.5 g/dL (ref 30.0–36.0)
MCV: 88.5 fl (ref 78.0–100.0)
Monocytes Absolute: 0.6 10*3/uL (ref 0.1–1.0)
Monocytes Relative: 11 % (ref 3.0–12.0)
Neutro Abs: 1.8 10*3/uL (ref 1.4–7.7)
Neutrophils Relative %: 33.2 % — ABNORMAL LOW (ref 43.0–77.0)
Platelets: 417 10*3/uL — ABNORMAL HIGH (ref 150.0–400.0)
RBC: 4.99 Mil/uL (ref 3.87–5.11)
RDW: 14.5 % (ref 11.5–15.5)
WBC: 5.5 10*3/uL (ref 4.0–10.5)

## 2021-11-07 LAB — HEPATITIS PANEL, ACUTE
Hep A IgM: NONREACTIVE
Hep B C IgM: NONREACTIVE
Hepatitis B Surface Ag: NONREACTIVE
Hepatitis C Ab: NONREACTIVE
SIGNAL TO CUT-OFF: 0.13 (ref <1.00)

## 2021-11-12 NOTE — Telephone Encounter (Signed)
Please schedule an appt with PCP

## 2021-11-12 NOTE — Telephone Encounter (Signed)
Pt scheduled  

## 2021-11-14 ENCOUNTER — Other Ambulatory Visit: Payer: Self-pay

## 2021-11-14 ENCOUNTER — Other Ambulatory Visit: Payer: 59

## 2021-11-14 ENCOUNTER — Encounter: Payer: Self-pay | Admitting: Physician Assistant

## 2021-11-14 ENCOUNTER — Ambulatory Visit: Payer: 59 | Admitting: Physician Assistant

## 2021-11-14 VITALS — BP 100/68 | HR 76 | Temp 98.1°F | Wt 153.0 lb

## 2021-11-14 DIAGNOSIS — R748 Abnormal levels of other serum enzymes: Secondary | ICD-10-CM | POA: Diagnosis not present

## 2021-11-14 DIAGNOSIS — R1013 Epigastric pain: Secondary | ICD-10-CM

## 2021-11-14 DIAGNOSIS — B2700 Gammaherpesviral mononucleosis without complication: Secondary | ICD-10-CM

## 2021-11-14 LAB — CBC WITH DIFFERENTIAL/PLATELET
Basophils Absolute: 0.1 K/uL (ref 0.0–0.1)
Basophils Relative: 1.5 % (ref 0.0–3.0)
Eosinophils Absolute: 0.1 K/uL (ref 0.0–0.7)
Eosinophils Relative: 2 % (ref 0.0–5.0)
HCT: 41.5 % (ref 36.0–46.0)
Hemoglobin: 13.6 g/dL (ref 12.0–15.0)
Lymphocytes Relative: 38.9 % (ref 12.0–46.0)
Lymphs Abs: 2.3 K/uL (ref 0.7–4.0)
MCHC: 32.9 g/dL (ref 30.0–36.0)
MCV: 87.9 fl (ref 78.0–100.0)
Monocytes Absolute: 0.6 K/uL (ref 0.1–1.0)
Monocytes Relative: 10.7 % (ref 3.0–12.0)
Neutro Abs: 2.8 K/uL (ref 1.4–7.7)
Neutrophils Relative %: 46.9 % (ref 43.0–77.0)
Platelets: 457 K/uL — ABNORMAL HIGH (ref 150.0–400.0)
RBC: 4.72 Mil/uL (ref 3.87–5.11)
RDW: 14.1 % (ref 11.5–15.5)
WBC: 6 K/uL (ref 4.0–10.5)

## 2021-11-14 LAB — COMPREHENSIVE METABOLIC PANEL
ALT: 58 U/L — ABNORMAL HIGH (ref 0–35)
AST: 25 U/L (ref 0–37)
Albumin: 4.2 g/dL (ref 3.5–5.2)
Alkaline Phosphatase: 105 U/L (ref 39–117)
BUN: 12 mg/dL (ref 6–23)
CO2: 28 mEq/L (ref 19–32)
Calcium: 9 mg/dL (ref 8.4–10.5)
Chloride: 103 mEq/L (ref 96–112)
Creatinine, Ser: 0.64 mg/dL (ref 0.40–1.20)
GFR: 121.9 mL/min (ref >60.00)
Glucose, Bld: 88 mg/dL (ref 70–99)
Potassium: 4.3 mEq/L (ref 3.5–5.1)
Sodium: 137 mEq/L (ref 135–145)
Total Bilirubin: 0.9 mg/dL (ref 0.2–1.2)
Total Protein: 7.2 g/dL (ref 6.0–8.3)

## 2021-11-14 LAB — LIPASE: Lipase: 18 U/L (ref 11.0–59.0)

## 2021-11-14 NOTE — Patient Instructions (Signed)
I will call with lab results and plan from there if U/S abdomen necessary or not.

## 2021-11-14 NOTE — Progress Notes (Signed)
Subjective:    Patient ID: Katherine Morgan, female    DOB: 12/05/1995, 26 y.o.   MRN: [id number]  Chief Complaint  Patient presents with   Abdominal Pain    Thought pain was gone but has come back twice-pain radiating towards back     Abdominal Pain  Patient is in today for recheck on epigastric pain and elevated liver function tests. 1/19 and 11/09/21 she had epigastric pain that felt like it was stabbing through to the back. 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 Pressure on the area helped. She went back on an antacid. Food does not seem to be a trigger.   The last 5 days she has been feeling well. No abdominal pain. No N/V. Eating normally. No fever or chills. No jaundice. No excessive fatigue.  Supposed to be leaving for [place] on a road trip tomorrow.   No past medical history on file.  History reviewed. No pertinent surgical history.  Family History  Problem Relation Age of Onset   Arthritis Mother    Diabetes Father    Hyperlipidemia Father    Hypertension Maternal Grandmother    Arthritis Maternal Grandmother    Diabetes Maternal Grandfather    Stroke Maternal Grandfather    Seizures Maternal Grandfather    Heart attack Maternal Grandfather    Early death Paternal Grandfather     Social History   Tobacco Use   Smoking status: Light Smoker    Types: Cigarettes   Smokeless tobacco: Never  Vaping Use   Vaping Use: Never used  Substance Use Topics   Alcohol use: Yes    Comment: Occassionally   Drug use: Never     No Known Allergies  Review of Systems  Gastrointestinal:  Positive for abdominal pain.  NEGATIVE UNLESS OTHERWISE INDICATED IN HPI      Objective:     BP 100/68    Pulse 76    Temp 98.1 F (36.7 C)    Wt 153 lb (69.4 kg)    LMP 10/23/2021    SpO2 100%    BMI 29.88 kg/m   Wt Readings from Last 3 Encounters:  11/14/21 153 lb  (69.4 kg)  11/02/21 156 lb 3.2 oz (70.9 kg)  05/17/21 153 lb 3.2 oz (69.5 kg)    BP Readings from Last 3 Encounters:  11/14/21 100/68  11/02/21 118/84  05/17/21 104/70     Physical Exam Vitals and nursing note reviewed.  Constitutional:      Appearance: Normal appearance. She is normal weight. She is not toxic-appearing.  HENT:     Head: Normocephalic and atraumatic.     Right Ear: Tympanic membrane, ear canal and external ear normal.     Left Ear: Tympanic membrane, ear canal and external ear normal.     Nose: Nose normal.     Mouth/Throat:     Mouth: Mucous membranes are moist.  Eyes:     Extraocular Movements: Extraocular movements intact.     Conjunctiva/sclera: Conjunctivae normal.     Pupils: Pupils are equal, round, and reactive to light.  Cardiovascular:     Rate and Rhythm: Normal rate and regular rhythm.     Pulses: Normal pulses.     Heart sounds: Normal heart sounds.  Pulmonary:     Effort: Pulmonary effort is normal.     Breath sounds: Normal breath sounds.  Abdominal:     General: Abdomen is flat. Bowel sounds are normal.     Palpations: Abdomen is soft.     Tenderness: There is no abdominal tenderness. There is no right CVA tenderness, left CVA tenderness, guarding or rebound. Negative signs include Murphy's sign.  Musculoskeletal:        General: Normal range of motion.     Cervical back: Normal range of motion and neck supple.  Lymphadenopathy:     Head:     Right side of head: No occipital adenopathy.     Left side of head: No occipital adenopathy.     Cervical: No cervical adenopathy.     Upper Body:     Right upper body: No supraclavicular or axillary adenopathy.     Left upper body: No supraclavicular or axillary adenopathy.  Skin:    General: Skin is warm and dry.  Neurological:     General: No focal deficit present.     Mental Status: She is alert and oriented to person, place, and time.  Psychiatric:        Mood and Affect: Mood normal.         Behavior: Behavior normal.        Thought Content: Thought content normal.        Judgment: Judgment normal.       Assessment & Plan:   Problem List Items Addressed This Visit   None Visit Diagnoses     Epigastric pain    -  Primary   Relevant Orders   CBC with Differential/Platelet   Comprehensive metabolic panel   Lipase   Elevated liver enzymes       Relevant Orders   CBC with Differential/Platelet   Comprehensive metabolic panel   Lipase   Infectious mononucleosis due to Epstein-Barr virus (EBV)       Relevant Orders   CBC with Differential/Platelet   Comprehensive metabolic panel   Lipase      1. Epigastric pain 2. Elevated liver enzymes 3. Infectious mononucleosis due to Epstein-Barr virus (EBV) -Elevated EBV IgM on 11/02/21, as well as elevated LFTs, thought to be secondary to mono virus. Improved in repeat LFTs on 11/06/21, but still elevated. Will recheck STAT CMP, Lipase, CBC today as she leaves for road trip tomorrow. Want to make sure her numbers are back to normal, otherwise I would plan for STAT U/S abdomen.  -Pt understanding and agreeable with plan   Dock Baccam M Ahmani Prehn, PA-C

## 2021-11-20 ENCOUNTER — Other Ambulatory Visit: Payer: Self-pay

## 2021-11-20 DIAGNOSIS — D75839 Thrombocytosis, unspecified: Secondary | ICD-10-CM

## 2021-12-13 ENCOUNTER — Other Ambulatory Visit: Payer: Self-pay

## 2021-12-13 ENCOUNTER — Other Ambulatory Visit (INDEPENDENT_AMBULATORY_CARE_PROVIDER_SITE_OTHER): Payer: 59

## 2021-12-13 DIAGNOSIS — D75839 Thrombocytosis, unspecified: Secondary | ICD-10-CM | POA: Diagnosis not present

## 2021-12-13 LAB — COMPREHENSIVE METABOLIC PANEL
ALT: 28 U/L (ref 0–35)
AST: 19 U/L (ref 0–37)
Albumin: 4.3 g/dL (ref 3.5–5.2)
Alkaline Phosphatase: 62 U/L (ref 39–117)
BUN: 15 mg/dL (ref 6–23)
CO2: 29 mEq/L (ref 19–32)
Calcium: 9.3 mg/dL (ref 8.4–10.5)
Chloride: 102 mEq/L (ref 96–112)
Creatinine, Ser: 0.68 mg/dL (ref 0.40–1.20)
GFR: 120.07 mL/min (ref >60.00)
Glucose, Bld: 100 mg/dL — ABNORMAL HIGH (ref 70–99)
Potassium: 3.9 mEq/L (ref 3.5–5.1)
Sodium: 135 mEq/L (ref 135–145)
Total Bilirubin: 0.8 mg/dL (ref 0.2–1.2)
Total Protein: 7 g/dL (ref 6.0–8.3)

## 2021-12-13 LAB — CBC WITH DIFFERENTIAL/PLATELET
Basophils Absolute: 0.1 10*3/uL (ref 0.0–0.1)
Basophils Relative: 0.7 % (ref 0.0–3.0)
Eosinophils Absolute: 0.3 10*3/uL (ref 0.0–0.7)
Eosinophils Relative: 3.3 % (ref 0.0–5.0)
HCT: 39.2 % (ref 36.0–46.0)
Hemoglobin: 13 g/dL (ref 12.0–15.0)
Lymphocytes Relative: 30.9 % (ref 12.0–46.0)
Lymphs Abs: 2.7 10*3/uL (ref 0.7–4.0)
MCHC: 33.2 g/dL (ref 30.0–36.0)
MCV: 87.6 fl (ref 78.0–100.0)
Monocytes Absolute: 0.8 10*3/uL (ref 0.1–1.0)
Monocytes Relative: 9 % (ref 3.0–12.0)
Neutro Abs: 4.9 10*3/uL (ref 1.4–7.7)
Neutrophils Relative %: 56.1 % (ref 43.0–77.0)
Platelets: 392 10*3/uL (ref 150.0–400.0)
RBC: 4.48 Mil/uL (ref 3.87–5.11)
RDW: 13.6 % (ref 11.5–15.5)
WBC: 8.7 10*3/uL (ref 4.0–10.5)

## 2021-12-21 ENCOUNTER — Ambulatory Visit
Admission: RE | Admit: 2021-12-21 | Discharge: 2021-12-21 | Disposition: A | Discharge status: Home / Self Care | Payer: 59 | Source: Ambulatory Visit | Attending: Family Medicine | Admitting: Family Medicine

## 2021-12-21 ENCOUNTER — Encounter: Payer: Self-pay | Admitting: Family Medicine

## 2021-12-21 ENCOUNTER — Encounter: Payer: Self-pay | Admitting: Physician Assistant

## 2021-12-21 ENCOUNTER — Other Ambulatory Visit: Payer: Self-pay | Admitting: *Deleted

## 2021-12-21 ENCOUNTER — Ambulatory Visit: Payer: 59 | Admitting: Physician Assistant

## 2021-12-21 ENCOUNTER — Other Ambulatory Visit: Payer: Self-pay

## 2021-12-21 ENCOUNTER — Ambulatory Visit: Payer: 59 | Admitting: Family Medicine

## 2021-12-21 VITALS — BP 118/70 | HR 84 | Temp 98.1°F | Ht 60.0 in | Wt 154.5 lb

## 2021-12-21 DIAGNOSIS — R1011 Right upper quadrant pain: Secondary | ICD-10-CM

## 2021-12-21 DIAGNOSIS — K8020 Calculus of gallbladder without cholecystitis without obstruction: Secondary | ICD-10-CM

## 2021-12-21 LAB — COMPREHENSIVE METABOLIC PANEL
ALT: 34 U/L (ref 0–35)
AST: 21 U/L (ref 0–37)
Albumin: 4.4 g/dL (ref 3.5–5.2)
Alkaline Phosphatase: 57 U/L (ref 39–117)
BUN: 12 mg/dL (ref 6–23)
CO2: 29 mEq/L (ref 19–32)
Calcium: 9.7 mg/dL (ref 8.4–10.5)
Chloride: 98 mEq/L (ref 96–112)
Creatinine, Ser: 0.61 mg/dL (ref 0.40–1.20)
GFR: 123.23 mL/min (ref >60.00)
Glucose, Bld: 97 mg/dL (ref 70–99)
Potassium: 3.9 mEq/L (ref 3.5–5.1)
Sodium: 134 mEq/L — ABNORMAL LOW (ref 135–145)
Total Bilirubin: 0.9 mg/dL (ref 0.2–1.2)
Total Protein: 7.1 g/dL (ref 6.0–8.3)

## 2021-12-21 LAB — CBC WITH DIFFERENTIAL/PLATELET
Basophils Absolute: 0.1 10*3/uL (ref 0.0–0.1)
Basophils Relative: 0.5 % (ref 0.0–3.0)
Eosinophils Absolute: 0.2 10*3/uL (ref 0.0–0.7)
Eosinophils Relative: 1.6 % (ref 0.0–5.0)
HCT: 39.1 % (ref 36.0–46.0)
Hemoglobin: 13 g/dL (ref 12.0–15.0)
Lymphocytes Relative: 30.4 % (ref 12.0–46.0)
Lymphs Abs: 3.5 10*3/uL (ref 0.7–4.0)
MCHC: 33.1 g/dL (ref 30.0–36.0)
MCV: 87.4 fl (ref 78.0–100.0)
Monocytes Absolute: 0.7 10*3/uL (ref 0.1–1.0)
Monocytes Relative: 6.3 % (ref 3.0–12.0)
Neutro Abs: 7.1 10*3/uL (ref 1.4–7.7)
Neutrophils Relative %: 61.2 % (ref 43.0–77.0)
Platelets: 413 10*3/uL — ABNORMAL HIGH (ref 150.0–400.0)
RBC: 4.48 Mil/uL (ref 3.87–5.11)
RDW: 14.1 % (ref 11.5–15.5)
WBC: 11.6 10*3/uL — ABNORMAL HIGH (ref 4.0–10.5)

## 2021-12-21 LAB — POCT URINE PREGNANCY: Preg Test, Ur: NEGATIVE

## 2021-12-21 NOTE — Patient Instructions (Signed)
It was very nice to see you today! ? ?If severe pain, won't go away, go to ER ?No greasy fatty foods ? ? ?PLEASE NOTE: ? ?If you had any lab tests please let us know if you have not heard back within a few days. You may see your results on MyChart before we have a chance to review them but we will give you a call once they are reviewed by [place]. If we ordered any referrals today, please let us know if you have not heard from their office within the next week.  ? ?Please try these tips to maintain a healthy lifestyle: ? ?Eat most of your calories during the day when you are active. Eliminate processed foods including packaged sweets (pies, cakes, cookies), reduce intake of potatoes, white bread, white pasta, and white rice. Look for whole grain options, oat flour or almond flour. ? ?Each meal should contain half fruits/vegetables, one quarter protein, and one quarter carbs (no bigger than a computer mouse). ? ?Cut down on sweet beverages. This includes juice, soda, and sweet tea. Also watch fruit intake, though this is a healthier sweet option, it still contains natural sugar! Limit to 3 servings daily. ? ?Drink at least 1 glass of water with each meal and aim for at least 8 glasses per day ? ?Exercise at least 150 minutes every week.   ?

## 2021-12-21 NOTE — Progress Notes (Signed)
? ?  Subjective:  ? ? ? Patient ID: Katherine Morgan, female    DOB: 09-Jul-1996, 26 y.o.   MRN: [id number] ? ?Chief Complaint  ?Patient presents with  ? Abdominal Pain  ?  Upper abdominal pain, right sided that radiates to back that started 4 am today  ? Emesis  ?  Has happened twice this morning  ? ? ?HPI ?Was having LUQ pain at first.  ?Upper abd pain since this am. RUQ Radiates to back.  Vomited twice this am-looked like food from last night.  Was tender to palp and stretch this am.  Now, just mild. Had eaten chinese at 2am this was at 4am.   No diarrhea. No f/c.    LMP 2/4-7/23. Condoms.  ?Has been seen in Jan for Pain.  LFT's elevated.  H pylori neg.  Thought to be d/t Mono.  ? ?Health Maintenance Due  ?Topic Date Due  ? HPV VACCINES (1 - 2-dose series) Never done  ? PAP-Cervical Cytology Screening  Never done  ? PAP SMEAR-Modifier  Never done  ? ? ?History reviewed. No pertinent past medical history. ? ?History reviewed. No pertinent surgical history. ? ?Outpatient Medications Prior to Visit  ?Medication Sig Dispense Refill  ? loratadine (CLARITIN) 10 MG tablet Take 10 mg by mouth daily as needed for allergies.    ? naphazoline-glycerin (CLEAR EYES REDNESS) 0.012-0.2 % SOLN 1-2 drops 4 (four) times daily as needed for eye irritation.    ? Vitamin D, Ergocalciferol, (DRISDOL) 1.25 MG (50000 UNIT) CAPS capsule One capsule by mouth once a week for 8 weeks. Then take 1000IU/day 8 capsule 0  ? ?No facility-administered medications prior to visit.  ? ? ?No Known Allergies ?ROS neg/noncontributory except as noted HPI/below ? ? ?   ?Objective:  ?  ? ?BP 118/70   Pulse 84   Temp 98.1 ?F (36.7 ?C) (Temporal)   Ht 5' (1.524 m)   Wt 154 lb 8 oz (70.1 kg)   LMP 11/24/2021 (Exact Date) Comment: 2/4-2/04-2022  SpO2 99%   BMI 30.17 kg/m?  ?Wt Readings from Last 3 Encounters:  ?12/21/21 154 lb 8 oz (70.1 kg)  ?11/14/21 153 lb (69.4 kg)  ?11/02/21 156 lb 3.2 oz (70.9 kg)  ? ?   ? ?Gen: WDWN NAD OHF ?HEENT: NCAT, conjunctiva  not injected, sclera nonicteric ?NECK:  supple, no thyromegaly, no nodes, no carotid bruits ?CARDIAC: RRR, S1S2+, no murmur. DP 2+B ?LUNGS: CTAB. No wheezes ?ABDOMEN:  BS+, soft, +mod pain to palp RUQ-+murphy, No HSM, no masses ?No CVAT ?EXT:  no edema ?MSK: no gross abnormalities.  ?NEURO: A&O x3.  CN II-XII intact.  ?PSYCH: normal mood. Good eye contact ? ?Results for orders placed or performed in visit on 12/21/21  ?POCT urine pregnancy  ?Result Value Ref Range  ? Preg Test, Ur Negative Negative  ?  ? ? ?Assessment & Plan:  ? ?Problem List Items Addressed This Visit   ?None ?Visit Diagnoses   ? ? RUQ abdominal pain    -  Primary  ? ?  ? RUQ pain.  Suspect gallbladder.  Labs-cbcd/urine preg/cmp, u/s.  If recurs,severe pain,persitant n/v, to ER.  No greasy/fatty foods.    ?Elevated lfts in Jan thought to be d/t mono.  Resolved 2 wks ago. ? ?No orders of the defined types were placed in this encounter. ? ? ?Angelena Sole, MD ? ?

## 2021-12-21 NOTE — Telephone Encounter (Signed)
Patient scheduled with Cherlynn Kaiser for 11:30 am. ?

## 2021-12-21 NOTE — Telephone Encounter (Signed)
Pt is already scheduled with [name] today at 11:30am ? ?Patient ?Name: ?Anamae PA ?Morgan ?Gender: Female ?DOB: Sep 08, 1996 ?Age: 26 Y 7 M 4 D ?Return ?Phone ?Number: ?[phone] ?(Primary) ?Address: ?City/ ?State/ ?Zip: [place] ?[address] ?[facility] - ?Client ?Health visitor at [facility] ?Contact Type Call ?Who Is Calling Patient / Member / Family / Caregiver ?Call Type Triage / Clinical ?Relationship To Patient Self ?Return Phone Number [phone] (Primary) ?Chief Complaint Vomiting ?Reason for Call Symptomatic / Request for Health Information ?Initial Comment Caller is having abdominal pain and vomiting. ?Translation No ?Nurse Assessment ?Nurse: Stefano Gaul, RN, Dwana Curd Date/Time (Eastern Time): 12/21/2021 8:22:39 AM ?Confirm and document reason for call. If ?symptomatic, describe symptoms. ?---Caller states she has been having abd pain. has been ?taking OTC medication for acid reflux. Pain started ?again at 4 am on the right side and is radiating to her ?back. pain level 5. Has vomited x 2. ?Does the patient have any new or worsening ?symptoms? ---Yes ?Will a triage be completed? ---Yes ?Related visit to physician within the last 2 weeks? ---No ?Does the PT have any chronic conditions? (i.e. ?diabetes, asthma, this includes High risk factors for ?pregnancy, etc.) ?---Yes ?List chronic conditions. ---acid reflux ?Is the patient pregnant or possibly pregnant? (Ask ?all females between the ages of 19-55) ---No ?Is this a behavioral health or substance abuse call? ---No ?Guidelines ?Guideline Title Affirmed Question Affirmed Notes Nurse Date/Time (Eastern ?Time) ?Abdominal Pain - ?Upper ?[1] MILDMODERATE pain ?AND [2] constant ?AND [3] present > 2 ?hours ?Stefano Gaul, RN, Dwana Curd 12/21/2021 8:26:17 AM ?Disp. Time (Eastern ?Time) Disposition Final User ?12/21/2021 8:31:00 AM See HCP within 4 Hours (or ?PCP triage) ?Yes Stefano Gaul, RN, Dwana Curd ?Caller Disagree/Comply  Comply ?Caller Understands Yes ?PreDisposition Home Care ?Care Advice Given Per Guideline ?SEE HCP (OR PCP TRIAGE) WITHIN 4 HOURS: * IF OFFICE WILL BE OPEN: You need to be seen within the next 3 or 4 ?hours. Call your doctor (or NP/PA) now or as soon as the office opens. CALL BACK IF: * You become worse CARE ADVICE ?given per Abdominal Pain, Upper (Adult) guideline. ?Comments ?User: Art Buff, RN Date/Time Lamount Cohen Time): 12/21/2021 8:30:32 AM ?called back line and spoke to Beecher and gave report that pt has upper right sided abd pain. Triage outcome see ?physician within 4 hrs. Warm transferred pt for appt ?Referrals ?REFERRED TO PCP OFFICE ?

## 2021-12-24 ENCOUNTER — Encounter (HOSPITAL_COMMUNITY)
Admission: EM | Disposition: A | Discharge status: Home / Self Care | Payer: Self-pay | Source: Home / Self Care | Attending: Emergency Medicine

## 2021-12-24 ENCOUNTER — Other Ambulatory Visit: Payer: Self-pay

## 2021-12-24 ENCOUNTER — Emergency Department (HOSPITAL_BASED_OUTPATIENT_CLINIC_OR_DEPARTMENT_OTHER): Payer: 59 | Admitting: Anesthesiology

## 2021-12-24 ENCOUNTER — Encounter (HOSPITAL_COMMUNITY): Payer: Self-pay

## 2021-12-24 ENCOUNTER — Emergency Department (HOSPITAL_COMMUNITY): Payer: 59 | Admitting: Anesthesiology

## 2021-12-24 ENCOUNTER — Ambulatory Visit (HOSPITAL_COMMUNITY)
Admission: EM | Admit: 2021-12-24 | Discharge: 2021-12-24 | Disposition: A | Discharge status: Home / Self Care | Payer: 59 | Attending: Emergency Medicine | Admitting: Emergency Medicine

## 2021-12-24 DIAGNOSIS — K8012 Calculus of gallbladder with acute and chronic cholecystitis without obstruction: Secondary | ICD-10-CM | POA: Insufficient documentation

## 2021-12-24 DIAGNOSIS — F17210 Nicotine dependence, cigarettes, uncomplicated: Secondary | ICD-10-CM | POA: Diagnosis not present

## 2021-12-24 DIAGNOSIS — K8000 Calculus of gallbladder with acute cholecystitis without obstruction: Secondary | ICD-10-CM

## 2021-12-24 DIAGNOSIS — K8020 Calculus of gallbladder without cholecystitis without obstruction: Secondary | ICD-10-CM | POA: Diagnosis present

## 2021-12-24 DIAGNOSIS — K810 Acute cholecystitis: Secondary | ICD-10-CM | POA: Diagnosis not present

## 2021-12-24 HISTORY — PX: CHOLECYSTECTOMY: SHX55

## 2021-12-24 LAB — COMPREHENSIVE METABOLIC PANEL
ALT: 53 U/L — ABNORMAL HIGH (ref 0–44)
AST: 35 U/L (ref 15–41)
Albumin: 4.3 g/dL (ref 3.5–5.0)
Alkaline Phosphatase: 62 U/L (ref 38–126)
Anion gap: 9 (ref 5–15)
BUN: 11 mg/dL (ref 6–20)
CO2: 26 mmol/L (ref 22–32)
Calcium: 9.4 mg/dL (ref 8.9–10.3)
Chloride: 100 mmol/L (ref 98–111)
Creatinine, Ser: 0.66 mg/dL (ref 0.44–1.00)
GFR, Estimated: >60 mL/min (ref >60)
Glucose, Bld: 136 mg/dL — ABNORMAL HIGH (ref 70–99)
Potassium: 3.6 mmol/L (ref 3.5–5.1)
Sodium: 135 mmol/L (ref 135–145)
Total Bilirubin: 0.9 mg/dL (ref 0.3–1.2)
Total Protein: 7.9 g/dL (ref 6.5–8.1)

## 2021-12-24 LAB — CBC
HCT: 41.6 % (ref 36.0–46.0)
Hemoglobin: 14 g/dL (ref 12.0–15.0)
MCH: 29.5 pg (ref 26.0–34.0)
MCHC: 33.7 g/dL (ref 30.0–36.0)
MCV: 87.8 fL (ref 80.0–100.0)
Platelets: 445 10*3/uL — ABNORMAL HIGH (ref 150–400)
RBC: 4.74 MIL/uL (ref 3.87–5.11)
RDW: 13.2 % (ref 11.5–15.5)
WBC: 16.2 10*3/uL — ABNORMAL HIGH (ref 4.0–10.5)
nRBC: 0 % (ref 0.0–0.2)

## 2021-12-24 LAB — URINALYSIS, ROUTINE W REFLEX MICROSCOPIC
Bacteria, UA: NONE SEEN
Bilirubin Urine: NEGATIVE
Glucose, UA: NEGATIVE mg/dL
Hgb urine dipstick: NEGATIVE
Ketones, ur: 5 mg/dL — AB
Nitrite: NEGATIVE
Protein, ur: 30 mg/dL — AB
Specific Gravity, Urine: 1.041 — ABNORMAL HIGH (ref 1.005–1.030)
pH: 6 (ref 5.0–8.0)

## 2021-12-24 LAB — I-STAT BETA HCG BLOOD, ED (MC, WL, AP ONLY): I-stat hCG, quantitative: <5 m[IU]/mL (ref <5)

## 2021-12-24 LAB — LIPASE, BLOOD: Lipase: 25 U/L (ref 11–51)

## 2021-12-24 SURGERY — LAPAROSCOPIC CHOLECYSTECTOMY
Anesthesia: General | Site: Abdomen

## 2021-12-24 MED ORDER — ACETAMINOPHEN 500 MG PO TABS
1000.0000 mg | ORAL_TABLET | ORAL | Status: AC
  Administered 2021-12-24: 1000 mg via ORAL
  Filled 2021-12-24: qty 2

## 2021-12-24 MED ORDER — PROPOFOL 10 MG/ML IV BOLUS
INTRAVENOUS | Status: AC
  Filled 2021-12-24: qty 20

## 2021-12-24 MED ORDER — FENTANYL CITRATE (PF) 250 MCG/5ML IJ SOLN
INTRAMUSCULAR | Status: DC | PRN
  Administered 2021-12-24 (×2): 50 ug via INTRAVENOUS

## 2021-12-24 MED ORDER — KETOROLAC TROMETHAMINE 30 MG/ML IJ SOLN
30.0000 mg | Freq: Once | INTRAMUSCULAR | Status: AC | PRN
  Administered 2021-12-24: 30 mg via INTRAVENOUS

## 2021-12-24 MED ORDER — ORAL CARE MOUTH RINSE: 15.0000 mL | Freq: Once | OROMUCOSAL | Status: AC

## 2021-12-24 MED ORDER — FENTANYL CITRATE (PF) 250 MCG/5ML IJ SOLN
INTRAMUSCULAR | Status: AC
  Filled 2021-12-24: qty 5

## 2021-12-24 MED ORDER — HYDROMORPHONE HCL 1 MG/ML IJ SOLN
0.2500 mg | INTRAMUSCULAR | Status: DC | PRN
  Administered 2021-12-24 (×2): 0.5 mg via INTRAVENOUS

## 2021-12-24 MED ORDER — DEXMEDETOMIDINE (PRECEDEX) IN NS 20 MCG/5ML (4 MCG/ML) IV SYRINGE
PREFILLED_SYRINGE | INTRAVENOUS | Status: DC | PRN
  Administered 2021-12-24: 4 ug via INTRAVENOUS
  Administered 2021-12-24: 8 ug via INTRAVENOUS

## 2021-12-24 MED ORDER — FENTANYL CITRATE (PF) 100 MCG/2ML IJ SOLN
INTRAMUSCULAR | Status: AC
  Administered 2021-12-24: 50 ug
  Filled 2021-12-24: qty 2

## 2021-12-24 MED ORDER — SODIUM CHLORIDE 0.9 % IV SOLN
2.0000 g | Freq: Once | INTRAVENOUS | Status: AC
  Administered 2021-12-24: 2 g via INTRAVENOUS
  Filled 2021-12-24: qty 20

## 2021-12-24 MED ORDER — DEXAMETHASONE SODIUM PHOSPHATE 10 MG/ML IJ SOLN
INTRAMUSCULAR | Status: AC
  Filled 2021-12-24: qty 1

## 2021-12-24 MED ORDER — KETOROLAC TROMETHAMINE 15 MG/ML IJ SOLN
INTRAMUSCULAR | Status: AC
  Filled 2021-12-24: qty 2

## 2021-12-24 MED ORDER — BUPIVACAINE-EPINEPHRINE 0.25% -1:200000 IJ SOLN
INTRAMUSCULAR | Status: DC | PRN
  Administered 2021-12-24: 7 mL

## 2021-12-24 MED ORDER — CHLORHEXIDINE GLUCONATE 0.12 % MT SOLN: 15.0000 mL | Freq: Once | OROMUCOSAL | Status: AC

## 2021-12-24 MED ORDER — LIDOCAINE 2% (20 MG/ML) 5 ML SYRINGE
INTRAMUSCULAR | Status: DC | PRN
  Administered 2021-12-24: 100 mg via INTRAVENOUS

## 2021-12-24 MED ORDER — OXYCODONE HCL 5 MG PO TABS: 5.0000 mg | ORAL_TABLET | Freq: Four times a day (QID) | ORAL | 0 refills | Status: DC | PRN

## 2021-12-24 MED ORDER — FENTANYL CITRATE PF 50 MCG/ML IJ SOSY
50.0000 ug | PREFILLED_SYRINGE | Freq: Once | INTRAMUSCULAR | Status: AC
  Administered 2021-12-24: 50 ug via INTRAVENOUS
  Filled 2021-12-24: qty 1

## 2021-12-24 MED ORDER — FENTANYL CITRATE PF 50 MCG/ML IJ SOSY: 50.0000 ug | PREFILLED_SYRINGE | Freq: Once | INTRAMUSCULAR | Status: DC

## 2021-12-24 MED ORDER — PROPOFOL 10 MG/ML IV BOLUS
INTRAVENOUS | Status: DC | PRN
  Administered 2021-12-24: 130 mg via INTRAVENOUS

## 2021-12-24 MED ORDER — HYDROMORPHONE HCL 1 MG/ML IJ SOLN
INTRAMUSCULAR | Status: AC
  Filled 2021-12-24: qty 1

## 2021-12-24 MED ORDER — ONDANSETRON HCL 4 MG/2ML IJ SOLN
4.0000 mg | Freq: Once | INTRAMUSCULAR | Status: AC
  Administered 2021-12-24: 4 mg via INTRAVENOUS
  Filled 2021-12-24: qty 2

## 2021-12-24 MED ORDER — INDOCYANINE GREEN 25 MG IV SOLR
INTRAVENOUS | Status: DC | PRN
  Administered 2021-12-24: 2.5 mg via INTRAVENOUS

## 2021-12-24 MED ORDER — ONDANSETRON HCL 4 MG/2ML IJ SOLN: 4.0000 mg | Freq: Once | INTRAMUSCULAR | Status: DC | PRN

## 2021-12-24 MED ORDER — SUGAMMADEX SODIUM 200 MG/2ML IV SOLN
INTRAVENOUS | Status: DC | PRN
  Administered 2021-12-24 (×2): 200 mg via INTRAVENOUS

## 2021-12-24 MED ORDER — INDOCYANINE GREEN 25 MG IV SOLR
1.0000 mg | Freq: Once | INTRAVENOUS | Status: DC
Start: 2021-12-24 — End: 2021-12-24

## 2021-12-24 MED ORDER — MIDAZOLAM HCL 2 MG/2ML IJ SOLN
INTRAMUSCULAR | Status: DC | PRN
Start: 2021-12-24 — End: 2021-12-24
  Administered 2021-12-24: 2 mg via INTRAVENOUS

## 2021-12-24 MED ORDER — DEXAMETHASONE SODIUM PHOSPHATE 10 MG/ML IJ SOLN
INTRAMUSCULAR | Status: DC | PRN
  Administered 2021-12-24: 10 mg via INTRAVENOUS

## 2021-12-24 MED ORDER — BUPIVACAINE-EPINEPHRINE (PF) 0.25% -1:200000 IJ SOLN
INTRAMUSCULAR | Status: AC
  Filled 2021-12-24: qty 30

## 2021-12-24 MED ORDER — ONDANSETRON HCL 4 MG/2ML IJ SOLN
INTRAMUSCULAR | Status: AC
  Filled 2021-12-24: qty 2

## 2021-12-24 MED ORDER — LACTATED RINGERS IV SOLN: INTRAVENOUS | Status: DC

## 2021-12-24 MED ORDER — LIDOCAINE 2% (20 MG/ML) 5 ML SYRINGE
INTRAMUSCULAR | Status: AC
  Filled 2021-12-24: qty 5

## 2021-12-24 MED ORDER — CHLORHEXIDINE GLUCONATE 0.12 % MT SOLN
OROMUCOSAL | Status: AC
  Administered 2021-12-24: 15 mL via OROMUCOSAL
  Filled 2021-12-24: qty 15

## 2021-12-24 MED ORDER — OXYCODONE HCL 5 MG PO TABS: 5.0000 mg | ORAL_TABLET | Freq: Once | ORAL | Status: DC | PRN

## 2021-12-24 MED ORDER — 0.9 % SODIUM CHLORIDE (POUR BTL) OPTIME
TOPICAL | Status: DC | PRN
  Administered 2021-12-24: 1000 mL

## 2021-12-24 MED ORDER — ROPIVACAINE HCL 5 MG/ML IJ SOLN
INTRAMUSCULAR | Status: DC | PRN
  Administered 2021-12-24: 30 mL

## 2021-12-24 MED ORDER — ROCURONIUM BROMIDE 10 MG/ML (PF) SYRINGE
PREFILLED_SYRINGE | INTRAVENOUS | Status: DC | PRN
  Administered 2021-12-24: 70 mg via INTRAVENOUS

## 2021-12-24 MED ORDER — SCOPOLAMINE 1 MG/3DAYS TD PT72
MEDICATED_PATCH | TRANSDERMAL | Status: DC | PRN
  Administered 2021-12-24: 1 via TRANSDERMAL

## 2021-12-24 MED ORDER — SODIUM CHLORIDE 0.9 % IR SOLN
Status: DC | PRN
  Administered 2021-12-24: 1000 mL

## 2021-12-24 MED ORDER — KETOROLAC TROMETHAMINE 15 MG/ML IJ SOLN
15.0000 mg | INTRAMUSCULAR | Status: AC
  Administered 2021-12-24: 15 mg via INTRAVENOUS
  Filled 2021-12-24: qty 1

## 2021-12-24 MED ORDER — SCOPOLAMINE 1 MG/3DAYS TD PT72
MEDICATED_PATCH | TRANSDERMAL | Status: AC
  Filled 2021-12-24: qty 1

## 2021-12-24 MED ORDER — PROPOFOL 500 MG/50ML IV EMUL
INTRAVENOUS | Status: DC | PRN
  Administered 2021-12-24: 25 ug/kg/min via INTRAVENOUS

## 2021-12-24 MED ORDER — MIDAZOLAM HCL 2 MG/2ML IJ SOLN
INTRAMUSCULAR | Status: AC
  Filled 2021-12-24: qty 2

## 2021-12-24 MED ORDER — ROCURONIUM BROMIDE 10 MG/ML (PF) SYRINGE
PREFILLED_SYRINGE | INTRAVENOUS | Status: AC
  Filled 2021-12-24: qty 10

## 2021-12-24 MED ORDER — OXYCODONE HCL 5 MG/5ML PO SOLN: 5.0000 mg | Freq: Once | ORAL | Status: DC | PRN

## 2021-12-24 SURGICAL SUPPLY — 36 items
APPLIER CLIP 5 13 M/L LIGAMAX5 (MISCELLANEOUS) ×2
BAG COUNTER SPONGE SURGICOUNT (BAG) ×2 IMPLANT
BLADE CLIPPER SURG (BLADE) IMPLANT
CANISTER SUCT 3000ML PPV (MISCELLANEOUS) ×2 IMPLANT
CHLORAPREP W/TINT 26 (MISCELLANEOUS) ×2 IMPLANT
CLIP APPLIE 5 13 M/L LIGAMAX5 (MISCELLANEOUS) ×1 IMPLANT
COVER SURGICAL LIGHT HANDLE (MISCELLANEOUS) ×2 IMPLANT
DERMABOND ADVANCED (GAUZE/BANDAGES/DRESSINGS) ×1
DERMABOND ADVANCED .7 DNX12 (GAUZE/BANDAGES/DRESSINGS) ×1 IMPLANT
ELECT REM PT RETURN 9FT ADLT (ELECTROSURGICAL) ×2
ELECTRODE REM PT RTRN 9FT ADLT (ELECTROSURGICAL) ×1 IMPLANT
GLOVE SURG ENC MOIS LTX SZ7 (GLOVE) ×2 IMPLANT
GLOVE SURG UNDER POLY LF SZ7.5 (GLOVE) ×2 IMPLANT
GOWN STRL REUS W/ TWL LRG LVL3 (GOWN DISPOSABLE) ×3 IMPLANT
GOWN STRL REUS W/TWL LRG LVL3 (GOWN DISPOSABLE) ×2
GRASPER SUT TROCAR 14GX15 (MISCELLANEOUS) ×2 IMPLANT
KIT BASIN OR (CUSTOM PROCEDURE TRAY) ×2 IMPLANT
KIT TURNOVER KIT B (KITS) ×2 IMPLANT
NS IRRIG 1000ML POUR BTL (IV SOLUTION) ×2 IMPLANT
PAD ARMBOARD 7.5X6 YLW CONV (MISCELLANEOUS) ×2 IMPLANT
POUCH RETRIEVAL ECOSAC 10 (ENDOMECHANICALS) ×1 IMPLANT
POUCH RETRIEVAL ECOSAC 10MM (ENDOMECHANICALS) ×1
SCISSORS LAP 5X35 DISP (ENDOMECHANICALS) ×2 IMPLANT
SET IRRIG TUBING LAPAROSCOPIC (IRRIGATION / IRRIGATOR) ×2 IMPLANT
SET TUBE SMOKE EVAC HIGH FLOW (TUBING) ×2 IMPLANT
SLEEVE ENDOPATH XCEL 5M (ENDOMECHANICALS) ×4 IMPLANT
SPECIMEN JAR SMALL (MISCELLANEOUS) ×2 IMPLANT
STRIP CLOSURE SKIN 1/2X4 (GAUZE/BANDAGES/DRESSINGS) ×2 IMPLANT
SUT MNCRL AB 4-0 PS2 18 (SUTURE) ×2 IMPLANT
SUT VICRYL 0 UR6 27IN ABS (SUTURE) ×2 IMPLANT
TOWEL GREEN STERILE (TOWEL DISPOSABLE) ×2 IMPLANT
TOWEL GREEN STERILE FF (TOWEL DISPOSABLE) ×2 IMPLANT
TRAY LAPAROSCOPIC MC (CUSTOM PROCEDURE TRAY) ×2 IMPLANT
TROCAR XCEL BLUNT TIP 100MML (ENDOMECHANICALS) ×2 IMPLANT
TROCAR XCEL NON-BLD 5MMX100MML (ENDOMECHANICALS) ×2 IMPLANT
WATER STERILE IRR 1000ML POUR (IV SOLUTION) ×1 IMPLANT

## 2021-12-24 NOTE — ED Triage Notes (Signed)
Pt arrives POV for eval for RUQ abd pain, N/V. Pt reports she was dx'd w/ cholelithiasis on Friday via U/S, supposed to f/u w/ surgery but reports pain has been too severe. This episode of pain started last night @ 2330. ?

## 2021-12-24 NOTE — Anesthesia Postprocedure Evaluation (Signed)
Anesthesia Post Note ? ?Patient: Katherine Morgan ? ?Procedure(s) Performed: LAPAROSCOPIC CHOLECYSTECTOMY (Abdomen) ?INDOCYANINE GREEN FLUORESCENCE IMAGING (ICG) (Abdomen) ? ?  ? ?Patient location during evaluation: PACU ?Anesthesia Type: General ?Level of consciousness: awake and alert ?Pain management: pain level controlled ?Vital Signs Assessment: post-procedure vital signs reviewed and stable ?Respiratory status: spontaneous breathing, nonlabored ventilation, respiratory function stable and patient connected to nasal cannula oxygen ?Cardiovascular status: blood pressure returned to baseline and stable ?Postop Assessment: no apparent nausea or vomiting ?Anesthetic complications: no ? ? ?No notable events documented. ? ?Last Vitals:  ?Vitals:  ? 12/24/21 1115 12/24/21 1130  ?BP: 105/73 119/85  ?Pulse: 66 83  ?Resp: 17 15  ?Temp:    ?SpO2: 100% 98%  ?  ?Last Pain:  ?Vitals:  ? 12/24/21 1134  ?TempSrc:   ?PainSc: 8   ? ? ?  ?  ?  ?  ?  ?  ? ?Aamilah Augenstein S ? ? ? ? ?

## 2021-12-24 NOTE — Transfer of Care (Signed)
Immediate Anesthesia Transfer of Care Note ? ?Patient: Katherine Morgan ? ?Procedure(s) Performed: LAPAROSCOPIC CHOLECYSTECTOMY (Abdomen) ?INDOCYANINE GREEN FLUORESCENCE IMAGING (ICG) (Abdomen) ? ?Patient Location: PACU ? ?Anesthesia Type:General ? ?Level of Consciousness: drowsy ? ?Airway & Oxygen Therapy: Patient Spontanous Breathing and Patient connected to face mask oxygen ? ?Post-op Assessment: Report given to RN and Post -op Vital signs reviewed and stable ? ?Post vital signs: Reviewed and stable ? ?Last Vitals:  ?Vitals Value Taken Time  ?BP 112/69 12/24/21 1058  ?Temp    ?Pulse 79 12/24/21 1100  ?Resp 20 12/24/21 1100  ?SpO2 98 % 12/24/21 1100  ?Vitals shown include unvalidated device data. ? ?Last Pain:  ?Vitals:  ? 12/24/21 0917  ?TempSrc: Oral  ?PainSc:   ?   ? ?Patients Stated Pain Goal: 2 (12/24/21 7062) ? ?Complications: No notable events documented. ?

## 2021-12-24 NOTE — Op Note (Signed)
Preoperative diagnosis: Symptomatic cholelithiasis ?Postoperative diagnosis: chronic cholecystitis ?Procedure: Laparoscopic cholecystectomy with ICG dye ?Surgeon: Dr. Serita Grammes ?Anesthesia: General  ?Estimated blood loss: Minimal ?Complications: None ?Drains: None ?Specimens: Gallbladder and contents to pathology ?Sponge and count was correct at completion ?Disposition: To recovery in stable condition ? 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  She has associated n/v. Pain radiates around right side.  She has [place] on Friday that shows cholelithiaisis with 1.5 cm stone, gb wall is 3.5 cm.  She has recent history of elevated lfts that has resolved after mono.  She came back today as she started having pain last night that has not abated. We discussed lap chole.  LFTs were basically normal except mild elevation of ALT.   ? ?Procedure: After informed consent was obtained she was taken to the OR She was given antibiotics.  SCDs were in place.  She was placed under general anesthesia without complication.  She was prepped and draped in the standard sterile surgical fashion.  A surgical timeout was then performed. ? ?I infiltrated Marcaine below the umbilicus.  I dissected through this and incised the scar and the fascia.  I entered the peritoneum bluntly.  There is no evidence of an entry injury.  I placed a 0 Vicryl pursestring suture through the fascia.  I inserted a Hassan trocar and insufflated the abdomen to 15 mmHg pressure.  I then placed 3 additional 5 mm trocars in the epigastrium and right side of the abdomen under direct vision without complication.  The gallbladder was tense and I had to aspirate it to grasp it.  I retracted it cephalad and lateral.  I bluntly took down some omental adhesions.  I then was able to view the critical view of safety.  This was confirmed with ICG dye. I then clipped the duct 3 times and divided it.  The clips completely  traversed the duct.  The 2 clips were left in place.  The duct was viable.  I treated the artery in a similar fashion.  I then remove the gallbladder from the liver bed without difficulty.  This was placed in a retrieval bag and removed from the abdomen.  I then obtained hemostasis.  Irrigation was performed and this was all clear.  I removed the hasson trocar and tied the pursestring down.  I then used the suture passer device with 0 Vicryl to completely obliterate the small defect.  I then removed the trocars and desufflated the abdomen.  I then closed these with 4 Monocryl and glue.  She tolerated this well was extubated and transferred to recovery stable. ?

## 2021-12-24 NOTE — Anesthesia Procedure Notes (Signed)
Anesthesia Procedure Image    

## 2021-12-24 NOTE — Anesthesia Preprocedure Evaluation (Addendum)
Anesthesia Evaluation  ?Patient identified by MRN, date of birth, ID band ?Patient awake ? ? ? ?Reviewed: ?Allergy & Precautions, NPO status , Patient's Chart, lab work & pertinent test results ? ?Airway ?Mallampati: III ? ?TM Distance: <3 FB ?Neck ROM: Full ? ? ? Dental ?no notable dental hx. ? ?  ?Pulmonary ?Current Smoker,  ?  ?Pulmonary exam normal ?breath sounds clear to auscultation ? ? ? ? ? ? Cardiovascular ?negative cardio ROS ?Normal cardiovascular exam ?Rhythm:Regular Rate:Normal ? ? ?  ?Neuro/Psych ?negative neurological ROS ? negative psych ROS  ? GI/Hepatic ?negative GI ROS, Neg liver ROS,   ?Endo/Other  ?negative endocrine ROS ? Renal/GU ?negative Renal ROS  ?negative genitourinary ?  ?Musculoskeletal ?negative musculoskeletal ROS ?(+)  ? Abdominal ?  ?Peds ?negative pediatric ROS ?(+)  Hematology ?negative hematology ROS ?(+)   ?Anesthesia Other Findings ? ? Reproductive/Obstetrics ?negative OB ROS ? ?  ? ? ? ? ? ? ? ? ? ? ? ? ? ?  ?  ? ? ? ? ? ? ? ?Anesthesia Physical ?Anesthesia Plan ? ?ASA: 2 ? ?Anesthesia Plan: General  ? ?Post-op Pain Management: Dilaudid IV  ? ?Induction: Intravenous ? ?PONV Risk Score and Plan: 2 and Ondansetron, Dexamethasone, Midazolam, Droperidol and Treatment may vary due to age or medical condition ? ?Airway Management Planned: Oral ETT ? ?Additional Equipment:  ? ?Intra-op Plan:  ? ?Post-operative Plan: Extubation in OR ? ?Informed Consent: I have reviewed the patients History and Physical, chart, labs and discussed the procedure including the risks, benefits and alternatives for the proposed anesthesia with the patient or authorized representative who has indicated his/her understanding and acceptance.  ? ? ? ?Dental advisory given ? ?Plan Discussed with: CRNA and Surgeon ? ?Anesthesia Plan Comments:   ? ? ? ? ? ? ?Anesthesia Quick Evaluation ? ?

## 2021-12-24 NOTE — H&P (Signed)
Katherine Morgan is an 26 y.o. female.   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  She has associated n/v. Pain radiates around right side.  She has [place] on Friday that shows cholelithiaisis with 1.5 cm stone, gb wall is 3.5 cm.  She has recent history of elevated lfts that has resolved after mono.  She came back today as she started having pain last night that has not abated.  ? ?History reviewed. No pertinent past medical history. ? ?History reviewed. No pertinent surgical history. ? ?Family History  ?Problem Relation Age of Onset  ? Arthritis Mother   ? Diabetes Father   ? Hyperlipidemia Father   ? Hypertension Maternal Grandmother   ? Arthritis Maternal Grandmother   ? Diabetes Maternal Grandfather   ? Stroke Maternal Grandfather   ? Seizures Maternal Grandfather   ? Heart attack Maternal Grandfather   ? Early death Paternal Grandfather   ? ?Social History:  reports that she has been smoking cigarettes. She has never used smokeless tobacco. She reports current alcohol use. She reports that she does not use drugs. ? ?Allergies: No Known Allergies ? ?No current facility-administered medications on file prior to encounter.  ? ?Current Outpatient Medications on File Prior to Encounter  ?Medication Sig Dispense Refill  ? loratadine (CLARITIN) 10 MG tablet Take 10 mg by mouth daily as needed for allergies.    ? naphazoline-glycerin (CLEAR EYES REDNESS) 0.012-0.2 % SOLN 1-2 drops 4 (four) times daily as needed for eye irritation.    ? Vitamin D, Ergocalciferol, (DRISDOL) 1.25 MG (50000 UNIT) CAPS capsule One capsule by mouth once a week for 8 weeks. Then take 1000IU/day 8 capsule 0  ?  ? ? ?Results for orders placed or performed during the hospital encounter of 12/24/21 (from the past 48 hour(s))  ?Lipase, blood     Status: None  ? Collection Time: 12/24/21  7:31 AM  ?Result Value Ref Range  ? Lipase 25 11 - 51 U/L  ?  Comment: Performed at [facility], [place] [address], [place], [place] [address]  ?Comprehensive metabolic panel     Status: Abnormal  ? Collection Time: 12/24/21  7:31 AM  ?Result Value Ref Range  ? Sodium 135 135 - 145 mmol/L  ? Potassium 3.6 3.5 - 5.1 mmol/L  ? Chloride 100 98 - 111 mmol/L  ? CO2 26 22 - 32 mmol/L  ? Glucose, Bld 136 (H) 70 - 99 mg/dL  ?  Comment: Glucose reference range applies only to samples taken after fasting for at least 8 hours.  ? BUN 11 6 - 20 mg/dL  ? Creatinine, Ser 0.66 0.44 - 1.00 mg/dL  ? Calcium 9.4 8.9 - 10.3 mg/dL  ? Total Protein 7.9 6.5 - 8.1 g/dL  ? Albumin 4.3 3.5 - 5.0 g/dL  ? AST 35 15 - 41 U/L  ? ALT 53 (H) 0 - 44 U/L  ? Alkaline Phosphatase 62 38 - 126 U/L  ? Total Bilirubin 0.9 0.3 - 1.2 mg/dL  ? GFR, Estimated >60 >60 mL/min  ?  Comment: (NOTE) ?Calculated using the CKD-EPI Creatinine Equation (2021) ?  ? Anion gap 9 5 - 15  ?  Comment: Performed at [facility], [place] [address] [place],  [address]  ?CBC     Status: Abnormal  ? Collection Time: 12/24/21  7:31 AM  ?Result Value Ref Range  ?  WBC 16.2 (H) 4.0 - 10.5 K/uL  ? RBC 4.74 3.87 - 5.11 MIL/uL  ? Hemoglobin 14.0 12.0 - 15.0 g/dL  ? HCT 41.6 36.0 - 46.0 %  ? MCV 87.8 80.0 - 100.0 fL  ? MCH 29.5 26.0 - 34.0 pg  ? MCHC 33.7 30.0 - 36.0 g/dL  ? RDW 13.2 11.5 - 15.5 %  ? Platelets 445 (H) 150 - 400 K/uL  ? nRBC 0.0 0.0 - 0.2 %  ?  Comment: Performed at [facility], [place] [address], [place], [place] [address]  ?Urinalysis, Routine w reflex microscopic     Status: Abnormal  ? Collection Time: 12/24/21  7:37 AM  ?Result Value Ref Range  ? Color, Urine YELLOW YELLOW  ? APPearance HAZY (A) CLEAR  ? Specific Gravity, Urine 1.041 (H) 1.005 - 1.030  ? pH 6.0 5.0 - 8.0  ? Glucose, UA NEGATIVE NEGATIVE mg/dL  ? Hgb urine dipstick NEGATIVE NEGATIVE  ? Bilirubin Urine NEGATIVE NEGATIVE  ? Ketones, ur 5 (A) NEGATIVE mg/dL  ? Protein, ur 30 (A) NEGATIVE mg/dL  ? Nitrite NEGATIVE NEGATIVE  ? Leukocytes,Ua SMALL (A) NEGATIVE  ? RBC / HPF  6-10 0 - 5 RBC/hpf  ? WBC, UA 0-5 0 - 5 WBC/hpf  ? Bacteria, UA NONE SEEN NONE SEEN  ? Squamous Epithelial / LPF 0-5 0 - 5  ? Mucus PRESENT   ?  Comment: Performed at [facility], [place] [address], [place], [place] [address]  ?I-Stat beta hCG blood, ED     Status: None  ? Collection Time: 12/24/21  7:38 AM  ?Result Value Ref Range  ? I-stat hCG, quantitative <5.0 <5 mIU/mL  ? Comment 3          ?  Comment:   GEST. AGE      CONC.  (mIU/mL) ?  <=1 WEEK        5 - 50 ?    2 WEEKS       50 - 500 ?    3 WEEKS       100 - 10,000 ?    4 WEEKS     1,000 - 30,000 ?       ?FEMALE AND NON-PREGNANT FEMALE: ?    LESS THAN 5 mIU/mL ?  ? ?No results found. ? ?Review of Systems  ?Constitutional:  Negative for fever.  ?Gastrointestinal:  Positive for abdominal pain, nausea and vomiting.  ?All other systems reviewed and are negative. ? ?Blood pressure (!) 137/95, pulse 73, temperature 98.2 ?F (36.8 ?C), temperature source Oral, resp. rate 14, height 5' (1.524 m), weight 69.4 kg, last menstrual period 11/24/2021, SpO2 99 %. ?Physical Exam ?Constitutional:   ?   Appearance: She is well-developed.  ?Cardiovascular:  ?   Rate and Rhythm: Normal rate.  ?Pulmonary:  ?   Effort: Pulmonary effort is normal.  ?Abdominal:  ?   General: There is no distension.  ?   Palpations: Abdomen is soft.  ?   Tenderness: There is abdominal tenderness in the right upper quadrant and epigastric area.  ?Skin: ?   Capillary Refill: Capillary refill takes less than 2 seconds.  ?Neurological:  ?   General: No focal deficit present.  ?   Mental Status: She is alert.  ?Psychiatric:     ?   Mood and Affect: Mood normal.     ?   Behavior: Behavior normal.  ?  ? ?Assessment/Plan ?Cholecystitis ?-I discussed the procedure in  detail.  We discussed the risks and benefits of a laparoscopic cholecystectomy and possible cholangiogram including, but not limited to bleeding, infection, injury to surrounding structures such as the intestine or liver, bile leak, retained  gallstones, need to convert to an open procedure, prolonged diarrhea, blood clots such as  DVT, common bile duct injury, anesthesia risks, and possible need for additional procedures.  The likelihood of improvement in symptoms and return to the patient's normal status is good. We discussed the typical post-operative recovery course. ? ?I reviewed ED provider notes, last 24 h vitals and pain scores, last 24 h labs and trends, and last 24 h imaging results. ? ?This care required moderate level of medical decision making.  ? ? ?Rolm Bookbinder, MD ?12/24/2021, 8:38 AM ? ? ? ?

## 2021-12-24 NOTE — Anesthesia Procedure Notes (Signed)
Procedure Name: Intubation ?Date/Time: 12/24/2021 9:38 AM ?Performed by: Erick Colace, CRNA ?Pre-anesthesia Checklist: Patient identified, Emergency Drugs available, Suction available and Patient being monitored ?Patient Re-evaluated:Patient Re-evaluated prior to induction ?Oxygen Delivery Method: Circle system utilized ?Preoxygenation: Pre-oxygenation with 100% oxygen ?Induction Type: IV induction ?Ventilation: Mask ventilation without difficulty ?Laryngoscope Size: Mac and 3 ?Grade View: Grade II ?Tube type: Oral ?Tube size: 7.0 mm ?Number of attempts: 1 ?Airway Equipment and Method: Stylet and Oral airway ?Placement Confirmation: ETT inserted through vocal cords under direct vision, positive ETCO2 and breath sounds checked- equal and bilateral ?Secured at: 21 cm ?Tube secured with: Tape ?Dental Injury: Teeth and Oropharynx as per pre-operative assessment  ? ? ? ? ?

## 2021-12-24 NOTE — Discharge Instructions (Signed)
[facility] ?LAPAROSCOPIC SURGERY: POST OP INSTRUCTIONS ? ?A prescription for pain medication may be given to you upon discharge.  Take your pain medication as prescribed, if needed.  If narcotic pain medicine is not needed, then you may take acetaminophen (Tylenol), naprosyn (Alleve), or ibuprofen (Advil) as needed. ?Take your usually prescribed medications unless otherwise directed. ?If you need a refill on your pain medication, please contact your pharmacy.  They will contact our office to request authorization. Prescriptions will not be filled after 5pm or on week-ends. ?You should follow a light diet the first few days after arrival home, such as soup and crackers, etc.  Be sure to include lots of fluids daily. ?Most patients will experience some swelling and bruising in the area of the incisions.  Ice packs will help.  Swelling and bruising can take several days to resolve.  ?It is common to experience some constipation if taking pain medication after surgery.  Increasing fluid intake and taking a stool softener (such as Colace) will usually help or prevent this problem from occurring.  A mild laxative (Milk of Magnesia or Miralax) should be taken according to package instructions if there are no bowel movements after 48 hours. ?I used skin glue on the incision, you may shower in 24 hours.  The glue will flake off over the next 2-3 weeks.   ?ACTIVITIES:  You may resume regular (light) daily activities beginning the next day--such as daily self-care, walking, climbing stairs--gradually increasing activities as tolerated.  You may have sexual intercourse when it is comfortable.  Refrain from any heavy lifting or straining until approved by your doctor. ?You may drive when you are no longer taking prescription pain medication, you can comfortably wear a seatbelt, and you can safely maneuver your car and apply brakes. ?RETURN TO WORK:   __________________________________________________________ ?You should see your doctor in the office for a follow-up appointment approximately 2-3 weeks after your surgery.  Make sure that you call for this appointment within a day or two after you arrive home to insure a convenient appointment time. ?OTHER INSTRUCTIONS: __________________________________________________________________________________________________________________________ __________________________________________________________________________________________________________________________ ?WHEN TO CALL YOUR DOCTOR: ?Fever over 101.0 ?Inability to urinate ?Continued bleeding from incision. ?Increased pain, redness, or drainage from the incision. ?Increasing abdominal pain ? ?The clinic staff is available to answer your questions during regular business hours.  Please don?t hesitate to call and ask to speak to one of the nurses for clinical concerns.  If you have a medical emergency, go to the nearest emergency room or call 911.  A surgeon from [facility] is always on call at the hospital. ?[address], [place], [place], [place]  [address] ? [address] [place], [place], [place]   [address] ?[phone] ? [phone] ? FAX [phone] ?Web site: [email or web address] ? ?

## 2021-12-24 NOTE — ED Notes (Signed)
Report given to short stay. Consent signed ?

## 2021-12-24 NOTE — Anesthesia Procedure Notes (Signed)
Anesthesia Regional Block: TAP block  ? ?Pre-Anesthetic Checklist: , timeout performed,  Correct Patient, Correct Site, Correct Laterality,  Correct Procedure, Correct Position, site marked,  Risks and benefits discussed,  Surgical consent,  Pre-op evaluation,  At surgeon's request and post-op pain management ? ?Laterality: Right ? ?Prep: chloraprep     ?  ?Needles:  ?Injection technique: Single-shot ? ?Needle Type: Echogenic Needle   ? ? ?Needle Length: 9cm  ? ? ? ? ?Additional Needles: ? ? ?Procedures:,,,, ultrasound used (permanent image in chart),,    ?Narrative:  ?Start time: 12/24/2021 9:42 AM ?End time: 12/24/2021 9:47 AM ?Injection made incrementally with aspirations every 5 mL. ? ?Performed by: Personally  ?Anesthesiologist: Eilene Ghazi, MD ? ?Additional Notes: ?Patient tolerated the procedure well without complications ? ? ? ?

## 2021-12-25 ENCOUNTER — Encounter (HOSPITAL_COMMUNITY): Payer: Self-pay | Admitting: General Surgery

## 2021-12-25 LAB — SURGICAL PATHOLOGY

## 2021-12-25 NOTE — ED Provider Notes (Signed)
?[facility] ?Provider Note ? ? ?CSN: [id number] ?Arrival date & time: 12/24/21  3419 ? ?  ? ?History ? ?Chief Complaint  ?Patient presents with  ? Abdominal Pain  ? ? ?Katherine Morgan is a 26 y.o. female. ? ? ?Abdominal Pain ?Patient presents with abdominal pain.  Known gallstones.  Outpatient ultrasound on Friday which was a few days before showed gallstones without cholecystitis.  Pain is now increased.  Unable to eat.  States she is eating now vomiting.  Pain is similar to pain she had before.  She is otherwise healthy. ?  ? ?Home Medications ?Prior to Admission medications   ?Medication Sig Start Date End Date Taking? Authorizing Provider  ?oxyCODONE (OXY IR/ROXICODONE) 5 MG immediate release tablet Take 1-2 tablets (5-10 mg total) by mouth every 6 (six) hours as needed for moderate pain, severe pain or breakthrough pain. 12/24/21  Yes Emelia Loron, MD  ?loratadine (CLARITIN) 10 MG tablet Take 10 mg by mouth daily as needed for allergies.    [provider]  ?naphazoline-glycerin (CLEAR EYES REDNESS) 0.012-0.2 % SOLN 1-2 drops 4 (four) times daily as needed for eye irritation.    [provider]  ?Vitamin D, Ergocalciferol, (DRISDOL) 1.25 MG (50000 UNIT) CAPS capsule One capsule by mouth once a week for 8 weeks. Then take 1000IU/day 01/14/20   Orland Mustard, MD  ?   ? ?Allergies    ?Patient has no known allergies.   ? ?Review of Systems   ?Review of Systems  ?Gastrointestinal:  Positive for abdominal pain.  ? ?Physical Exam ?Updated Vital Signs ?BP 118/81 (BP Location: Right Arm)   Pulse 77   Temp 98.5 ?F (36.9 ?C)   Resp 15   Ht 5' (1.524 m)   Wt 69.4 kg   LMP 11/24/2021 (Exact Date) Comment: 2/4-2/04-2022  SpO2 98%   BMI 29.88 kg/m?  ?Physical Exam ?Abdominal:  ?   Tenderness: There is abdominal tenderness.  ?   Comments: Right upper quadrant tenderness without rebound or guarding.  No hernia palpated.  ? ? ?ED Results / Procedures / Treatments   ?Labs ?(all labs  ordered are listed, but only abnormal results are displayed) ?Labs Reviewed  ?COMPREHENSIVE METABOLIC PANEL - Abnormal; Notable for the following components:  ?    Result Value  ? Glucose, Bld 136 (*)   ? ALT 53 (*)   ? All other components within normal limits  ?CBC - Abnormal; Notable for the following components:  ? WBC 16.2 (*)   ? Platelets 445 (*)   ? All other components within normal limits  ?URINALYSIS, ROUTINE W REFLEX MICROSCOPIC - Abnormal; Notable for the following components:  ? APPearance HAZY (*)   ? Specific Gravity, Urine 1.041 (*)   ? Ketones, ur 5 (*)   ? Protein, ur 30 (*)   ? Leukocytes,Ua SMALL (*)   ? All other components within normal limits  ?LIPASE, BLOOD  ?I-STAT BETA HCG BLOOD, ED ([facility], [facility], [facility] ONLY)  ?SURGICAL PATHOLOGY  ? ? ?EKG ?None ? ?Radiology ?No results found. ? ?Procedures ?Procedures  ? ? ?Medications Ordered in ED ?Medications  ?fentaNYL (SUBLIMAZE) injection 50 mcg (50 mcg Intravenous Given 12/24/21 0815)  ?ondansetron [facility]) injection 4 mg (4 mg Intravenous Given 12/24/21 0814)  ?acetaminophen (TYLENOL) tablet 1,000 mg (1,000 mg Oral Given 12/24/21 0919)  ?ketorolac (TORADOL) 15 MG/ML injection 15 mg (15 mg Intravenous Given 12/24/21 1030)  ?cefTRIAXone (ROCEPHIN) 2 g in sodium chloride 0.9 % 100 mL  IVPB (2 g Intravenous New Bag/Given 12/24/21 0939)  ?ketorolac (TORADOL) 30 MG/ML injection 30 mg (30 mg Intravenous Given 12/24/21 1204)  ?chlorhexidine (PERIDEX) 0.12 % solution 15 mL (15 mLs Mouth/Throat Given 12/24/21 0919)  ?  Or  ?MEDLINE mouth rinse ( Mouth Rinse See Alternative 12/24/21 0919)  ?fentaNYL (SUBLIMAZE) 100 MCG/2ML injection (50 mcg  Given 12/24/21 0921)  ? ? ?ED Course/ Medical Decision Making/ A&P ?  ?                        ?Medical Decision Making ?Risk ?Prescription drug management. ? ? ?Patient presents with right upper quadrant abdominal pain.  Recent ultrasound showing gallstones.  Presumed acute cholecystitis.  Discussed upon arrival with general surgery.  Lab work  reviewed and white count is elevated.  Has been seen by general surgery will be taken to the OR. ? ? ? ? ? ? ? ?Final Clinical Impression(s) / ED Diagnoses ?Final diagnoses:  ?Calculus of gallbladder with acute cholecystitis without obstruction  ? ? ?Rx / DC Orders ?ED Discharge Orders   ? ?      Ordered  ?  oxyCODONE (OXY IR/ROXICODONE) 5 MG immediate release tablet  Every 6 hours PRN       ? 12/24/21 1037  ? ?  ?  ? ?  ? ? ?  ?Benjiman Core, MD ?12/25/21 1234 ? ?

## 2022-05-17 ENCOUNTER — Encounter: Payer: Self-pay | Admitting: Physician Assistant

## 2022-05-17 ENCOUNTER — Ambulatory Visit (INDEPENDENT_AMBULATORY_CARE_PROVIDER_SITE_OTHER)
Admission: RE | Admit: 2022-05-17 | Discharge: 2022-05-17 | Disposition: A | Discharge status: Home / Self Care | Payer: 59 | Source: Ambulatory Visit | Attending: Physician Assistant | Admitting: Physician Assistant

## 2022-05-17 ENCOUNTER — Ambulatory Visit (INDEPENDENT_AMBULATORY_CARE_PROVIDER_SITE_OTHER): Payer: 59 | Admitting: Physician Assistant

## 2022-05-17 VITALS — BP 124/84 | HR 76 | Temp 98.5°F | Ht 60.0 in | Wt 158.4 lb

## 2022-05-17 DIAGNOSIS — M545 Low back pain, unspecified: Secondary | ICD-10-CM | POA: Diagnosis not present

## 2022-05-17 MED ORDER — MELOXICAM 15 MG PO TABS: 15.0000 mg | ORAL_TABLET | Freq: Every day | ORAL | 0 refills | Status: AC

## 2022-05-17 NOTE — Progress Notes (Signed)
Subjective:    Patient ID: Katherine Morgan, female    DOB: [birth year], 26 y.o.   MRN: [id number]  Chief Complaint  Patient presents with   Back Pain    Pt c/o lower back pain over the past month after rest it feels better, but after working it comes back; groin pain on right side and been present a week. Pt going to make an appt for [facility] and first time Pap;     Back Pain   Patient is in today for low back pain starting in June 2023, just over one month ago. Hurting worse during work when bending over and with jogging. Works as a Engineer, civil (consulting), rolls patients over often. Flares back up after rest. Mostly right sided. Hanging from a bar at the gym made the pain worse. Pain does not go down into her leg. Now having some intermittent right sided groin pain as well. No numbness or tingling. No fever or chills.   No dysuria or urinary frequency. Bowel movements normal. No pain with intercourse. LMP last month, due for cycle soon.   Tx's tried: Lidocaine patches OTC helps some as well as occasional Tylenol.   History reviewed. No pertinent past medical history.  Past Surgical History:  Procedure Laterality Date   CHOLECYSTECTOMY N/A 12/24/2021   Procedure: LAPAROSCOPIC CHOLECYSTECTOMY;  Surgeon: Emelia Loron, MD;  [facility];  Service: General;  Laterality: N/A;    Family History  Problem Relation Age of Onset   Arthritis Mother    Diabetes Father    Hyperlipidemia Father    Hypertension Maternal Grandmother    Arthritis Maternal Grandmother    Diabetes Maternal Grandfather    Stroke Maternal Grandfather    Seizures Maternal Grandfather    Heart attack Maternal Grandfather    Early death Paternal Grandfather     Social History   Tobacco Use   Smoking status: Former    Types: Cigarettes    Quit date: 03/21/2017    Years since quitting: 5.1   Smokeless tobacco: Never  Vaping Use   Vaping Use: Never used  Substance Use Topics   Alcohol use: Yes    Comment: Occassionally    Drug use: Never     No Known Allergies  Review of Systems  Musculoskeletal:  Positive for back pain.   NEGATIVE UNLESS OTHERWISE INDICATED IN HPI      Objective:     BP 124/84 (BP Location: Right Arm)   Pulse 76   Temp 98.5 F (36.9 C) (Temporal)   Ht 5' (1.524 m)   Wt 158 lb 6.4 oz (71.8 kg)   LMP 04/19/2022 (Exact Date)   SpO2 99%   BMI 30.94 kg/m   Wt Readings from Last 3 Encounters:  05/17/22 158 lb 6.4 oz (71.8 kg)  12/24/21 153 lb (69.4 kg)  12/21/21 154 lb 8 oz (70.1 kg)    BP Readings from Last 3 Encounters:  05/17/22 124/84  12/24/21 118/81  12/21/21 118/70     Physical Exam Constitutional:      Appearance: Normal appearance.  Cardiovascular:     Rate and Rhythm: Normal rate and regular rhythm.  Pulmonary:     Effort: Pulmonary effort is normal.     Breath sounds: Normal breath sounds.  Abdominal:     General: Abdomen is flat. Bowel sounds are normal. There is no distension.     Palpations: Abdomen is soft. There is no mass.     Tenderness: There is no abdominal tenderness.  There is no right CVA tenderness or left CVA tenderness.  Musculoskeletal:     Lumbar back: Normal. No swelling, deformity, tenderness or bony tenderness. Normal range of motion. Negative right straight leg raise test and negative left straight leg raise test.  Neurological:     General: No focal deficit present.     Mental Status: She is alert and oriented to person, place, and time.     Sensory: No sensory deficit.     Motor: No weakness.     Gait: Gait normal.        Assessment & Plan:   Problem List Items Addressed This Visit   None Visit Diagnoses     Acute right-sided low back pain without sciatica    -  Primary   Relevant Medications   meloxicam (MOBIC) 15 MG tablet   Other Relevant Orders   DG Lumbar Spine Complete        Meds ordered this encounter  Medications   meloxicam (MOBIC) 15 MG tablet    Sig: Take 1 tablet (15 mg total) by mouth daily for  15 days.    Dispense:  15 tablet    Refill:  0    Order Specific Question:   Supervising Provider    Answer:   Durene Cal, STEPHEN O [[id number]]    1. Acute right-sided low back pain without sciatica -Negative for any fevers, saddle anesthesia, foot-drop, incontinence of urine or stool, hx of cancer, and no recent injury. Should any of these develop, she will need to go to ED. -Most likely MSK in nature, probably overuse given duties of her work. -XRAY at [facility] to check bony structure -Meloxicam as directed -PT / stretches / strength training encouraged  Pt to recheck prn    Katherine Lennox M Deirdre Gryder, PA-C
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
## 2022-07-12 ENCOUNTER — Encounter: Payer: Self-pay | Admitting: Physician Assistant

## 2022-07-15 ENCOUNTER — Encounter: Payer: Self-pay | Admitting: *Deleted

## 2022-07-15 NOTE — Telephone Encounter (Signed)
Called pt to advise provider recommendations and vm box full unable to lvm, sending provider advice via MyChart message.

## 2022-08-13 ENCOUNTER — Other Ambulatory Visit (HOSPITAL_COMMUNITY)
Admission: RE | Admit: 2022-08-13 | Discharge: 2022-08-13 | Disposition: A | Discharge status: Home / Self Care | Payer: 59 | Source: Ambulatory Visit | Attending: Physician Assistant | Admitting: Physician Assistant

## 2022-08-13 ENCOUNTER — Ambulatory Visit (INDEPENDENT_AMBULATORY_CARE_PROVIDER_SITE_OTHER): Payer: 59 | Admitting: Physician Assistant

## 2022-08-13 ENCOUNTER — Encounter: Payer: Self-pay | Admitting: Physician Assistant

## 2022-08-13 VITALS — BP 116/84 | HR 82 | Temp 97.5°F | Ht 60.0 in | Wt 161.4 lb

## 2022-08-13 DIAGNOSIS — Z0000 Encounter for general adult medical examination without abnormal findings: Secondary | ICD-10-CM | POA: Insufficient documentation

## 2022-08-13 LAB — CBC WITH DIFFERENTIAL/PLATELET
Basophils Absolute: 0 10*3/uL (ref 0.0–0.1)
Basophils Relative: 0.4 % (ref 0.0–3.0)
Eosinophils Absolute: 0.3 10*3/uL (ref 0.0–0.7)
Eosinophils Relative: 3.5 % (ref 0.0–5.0)
HCT: 40.8 % (ref 36.0–46.0)
Hemoglobin: 13.4 g/dL (ref 12.0–15.0)
Lymphocytes Relative: 36.3 % (ref 12.0–46.0)
Lymphs Abs: 3.2 10*3/uL (ref 0.7–4.0)
MCHC: 32.8 g/dL (ref 30.0–36.0)
MCV: 89.9 fl (ref 78.0–100.0)
Monocytes Absolute: 0.5 10*3/uL (ref 0.1–1.0)
Monocytes Relative: 6.1 % (ref 3.0–12.0)
Neutro Abs: 4.7 10*3/uL (ref 1.4–7.7)
Neutrophils Relative %: 53.7 % (ref 43.0–77.0)
Platelets: 385 10*3/uL (ref 150.0–400.0)
RBC: 4.54 Mil/uL (ref 3.87–5.11)
RDW: 13.5 % (ref 11.5–15.5)
WBC: 8.8 10*3/uL (ref 4.0–10.5)

## 2022-08-13 LAB — COMPREHENSIVE METABOLIC PANEL
ALT: 23 U/L (ref 0–35)
AST: 21 U/L (ref 0–37)
Albumin: 4.1 g/dL (ref 3.5–5.2)
Alkaline Phosphatase: 58 U/L (ref 39–117)
BUN: 9 mg/dL (ref 6–23)
CO2: 26 mEq/L (ref 19–32)
Calcium: 9 mg/dL (ref 8.4–10.5)
Chloride: 102 mEq/L (ref 96–112)
Creatinine, Ser: 0.6 mg/dL (ref 0.40–1.20)
GFR: 123.17 mL/min (ref >60.00)
Glucose, Bld: 103 mg/dL — ABNORMAL HIGH (ref 70–99)
Potassium: 3.8 mEq/L (ref 3.5–5.1)
Sodium: 136 mEq/L (ref 135–145)
Total Bilirubin: 0.6 mg/dL (ref 0.2–1.2)
Total Protein: 6.8 g/dL (ref 6.0–8.3)

## 2022-08-13 LAB — LIPID PANEL
Cholesterol: 164 mg/dL (ref 0–200)
HDL: 40.6 mg/dL (ref >39.00)
LDL Cholesterol: 103 mg/dL — ABNORMAL HIGH (ref 0–99)
NonHDL: 123.28
Total CHOL/HDL Ratio: 4
Triglycerides: 103 mg/dL (ref 0.0–149.0)
VLDL: 20.6 mg/dL (ref 0.0–40.0)

## 2022-08-13 LAB — TSH: TSH: 1.69 u[IU]/mL (ref 0.35–5.50)

## 2022-08-13 NOTE — Assessment & Plan Note (Signed)
Age-appropriate screening and counseling performed today. Will check labs and call with results. Preventive measures discussed and printed in AVS for patient. First pap smear completed, will call with results. Included screening for G/C/T, HSV, HPV.  Patient Counseling: [x]   Nutrition: Stressed importance of moderation in sodium/caffeine intake, saturated fat and cholesterol, caloric balance, sufficient intake of fresh fruits, vegetables, and fiber.  [x]   Stressed the importance of regular exercise.   [x]   Substance Abuse: Discussed cessation/primary prevention of tobacco, alcohol, or other drug use; driving or other dangerous activities under the influence; availability of treatment for abuse.   []   Injury prevention: Discussed safety belts, safety helmets, smoke detector, smoking near bedding or upholstery.   [x]   Sexuality: Discussed sexually transmitted diseases, partner selection, use of condoms, avoidance of unintended pregnancy  and contraceptive alternatives.   [x]   Dental health: Discussed importance of regular tooth brushing, flossing, and dental visits.  [x]   Health maintenance and immunizations reviewed. Please refer to Health maintenance section.

## 2022-08-13 NOTE — Progress Notes (Signed)
Subjective:    Patient ID: Katherine Morgan, female    DOB: [birth year], 26 y.o.   MRN: [id number]  Chief Complaint  Patient presents with   Annual Exam    Pt in for annual CPE with Pap; pt is fasting if needing to do labs, last labs done in March of 2023;     HPI Patient is in today for annual well woman exam. Requesting fasting labs. Never had a pap smear, plans to do this today. Sexually active. Regular cycles. LMP 07/28/22. No new concerns.   History reviewed. No pertinent past medical history.  Past Surgical History:  Procedure Laterality Date   CHOLECYSTECTOMY N/A 12/24/2021   Procedure: LAPAROSCOPIC CHOLECYSTECTOMY;  Surgeon: Rolm Bookbinder, MD;  Location: [place];  Service: General;  Laterality: N/A;    Family History  Problem Relation Age of Onset   Arthritis Mother    Diabetes Father    Hyperlipidemia Father    Hypertension Maternal Grandmother    Arthritis Maternal Grandmother    Diabetes Maternal Grandfather    Stroke Maternal Grandfather    Seizures Maternal Grandfather    Heart attack Maternal Grandfather    Early death Paternal Grandfather     Social History   Tobacco Use   Smoking status: Former    Types: Cigarettes    Quit date: 03/21/2017    Years since quitting: 5.4   Smokeless tobacco: Never  Vaping Use   Vaping Use: Never used  Substance Use Topics   Alcohol use: Yes    Comment: Occassionally   Drug use: Never     No Known Allergies  Review of Systems NEGATIVE UNLESS OTHERWISE INDICATED IN HPI      Objective:     BP 116/84 (BP Location: Left Arm)   Pulse 82   Temp (!) 97.5 F (36.4 C) (Temporal)   Ht 5' (1.524 m)   Wt 161 lb 6.4 oz (73.2 kg)   LMP 07/28/2022 (Exact Date)   SpO2 96%   BMI 31.52 kg/m   Wt Readings from Last 3 Encounters:  08/13/22 161 lb 6.4 oz (73.2 kg)  05/17/22 158 lb 6.4 oz (71.8 kg)  12/24/21 153 lb (69.4 kg)    BP Readings from Last 3 Encounters:  08/13/22 116/84  05/17/22 124/84  12/24/21 118/81      Physical Exam Vitals and nursing note reviewed. Exam conducted with a chaperone present.  Constitutional:      Appearance: Normal appearance. She is normal weight. She is not toxic-appearing.  HENT:     Head: Normocephalic and atraumatic.     Right Ear: Tympanic membrane, ear canal and external ear normal.     Left Ear: Tympanic membrane, ear canal and external ear normal.     Nose: Nose normal.     Mouth/Throat:     Mouth: Mucous membranes are moist.  Eyes:     Extraocular Movements: Extraocular movements intact.     Conjunctiva/sclera: Conjunctivae normal.     Pupils: Pupils are equal, round, and reactive to light.  Cardiovascular:     Rate and Rhythm: Normal rate and regular rhythm.     Pulses: Normal pulses.     Heart sounds: Normal heart sounds.  Pulmonary:     Effort: Pulmonary effort is normal.     Breath sounds: Normal breath sounds.  Chest:  Breasts:    Right: Normal. No bleeding, inverted nipple or mass.     Left: Normal. No bleeding, inverted nipple or mass.  Abdominal:  General: Abdomen is flat. Bowel sounds are normal.     Palpations: Abdomen is soft.     Tenderness: There is no abdominal tenderness.     Comments: Surgical scars noted  Genitourinary:    Labia:        Right: No rash (Right labial some corrugated tissue noted) or tenderness.        Left: No rash or tenderness.      Vagina: Vaginal discharge present. No bleeding or lesions.     Cervix: Normal.     Uterus: Normal.   Musculoskeletal:        General: Normal range of motion.     Cervical back: Normal range of motion and neck supple.  Skin:    General: Skin is warm and dry.     Findings: No lesion or rash.  Neurological:     General: No focal deficit present.     Mental Status: She is alert and oriented to person, place, and time.  Psychiatric:        Mood and Affect: Mood normal.        Behavior: Behavior normal.        Thought Content: Thought content normal.        Judgment:  Judgment normal.        Assessment & Plan:  Encounter for annual physical exam Assessment & Plan: Age-appropriate screening and counseling performed today. Will check labs and call with results. Preventive measures discussed and printed in AVS for patient. First pap smear completed, will call with results. Included screening for G/C/T, HSV, HPV.  Patient Counseling: [x]   Nutrition: Stressed importance of moderation in sodium/caffeine intake, saturated fat and cholesterol, caloric balance, sufficient intake of fresh fruits, vegetables, and fiber.  [x]   Stressed the importance of regular exercise.   [x]   Substance Abuse: Discussed cessation/primary prevention of tobacco, alcohol, or other drug use; driving or other dangerous activities under the influence; availability of treatment for abuse.   []   Injury prevention: Discussed safety belts, safety helmets, smoke detector, smoking near bedding or upholstery.   [x]   Sexuality: Discussed sexually transmitted diseases, partner selection, use of condoms, avoidance of unintended pregnancy  and contraceptive alternatives.   [x]   Dental health: Discussed importance of regular tooth brushing, flossing, and dental visits.  [x]   Health maintenance and immunizations reviewed. Please refer to Health maintenance section.      Orders: -     Cytology - PAP -     CBC with Differential/Platelet -     Comprehensive metabolic panel -     Lipid panel -     TSH      Return in about 1 year (around 08/14/2023) for Well woman, labs.     Daelyn Pettaway M Ishmael Berkovich, PA-C

## 2022-08-23 ENCOUNTER — Other Ambulatory Visit: Payer: Self-pay

## 2022-08-23 ENCOUNTER — Encounter: Payer: Self-pay | Admitting: Physician Assistant

## 2022-08-23 DIAGNOSIS — R87612 Low grade squamous intraepithelial lesion on cytologic smear of cervix (LGSIL): Secondary | ICD-10-CM

## 2022-08-23 DIAGNOSIS — R87810 Cervical high risk human papillomavirus (HPV) DNA test positive: Secondary | ICD-10-CM

## 2022-08-23 LAB — CYTOLOGY - PAP
Chlamydia: NEGATIVE
Comment: NEGATIVE
Comment: NEGATIVE
Comment: NEGATIVE
Comment: NEGATIVE
Comment: NORMAL
HSV1: NEGATIVE
HSV2: NEGATIVE
High risk HPV: POSITIVE — AB
Neisseria Gonorrhea: NEGATIVE
Trichomonas: NEGATIVE

## 2022-08-23 NOTE — Telephone Encounter (Signed)
Please see pt message and if there are any recommendations on a specific provider I will place referral to that office. Thanks

## 2022-08-24 ENCOUNTER — Encounter: Payer: Self-pay | Admitting: Physician Assistant

## 2022-09-24 ENCOUNTER — Ambulatory Visit: Payer: 59 | Admitting: Obstetrics & Gynecology

## 2022-09-24 NOTE — Progress Notes (Signed)
26 y.o. No obstetric history on file. Single Hispanic female here for NEW GYN/ problem visit. Pt was referred by primary care. 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  Testing negative for GC/CT/Trich/HSV on 08/13/22.  Her menstrual period started today.  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  Not a smoker.   PCP:   Theresa Duty, PA- C  Patient's last menstrual period was 10/03/2022.     Period Cycle (Days): 28 Period Duration (Days): 4-5 Period Pattern: Regular     Sexually active: Yes.    The current method of family planning is condoms. Exercising: Yes.       Smoker:  no, former  Health Maintenance: Pap:  08/13/22, LSIL: positive HR HPV History of abnormal Pap:  no MMG:  n/a Colonoscopy:  n/a BMD:   n/a  Result  n/a TDaP:  04/27/18 Gardasil:   Unable to get records but believes that she did not do this series.  HIV: yes, neg Hep C: yes, neg    reports that she quit smoking about 5 years ago. Her smoking use included cigarettes. She has never used smokeless tobacco. She reports current alcohol use. She reports that she does not use drugs.  History reviewed. No pertinent past medical history.  Past Surgical History:  Procedure Laterality Date   CHOLECYSTECTOMY N/A 12/24/2021   Procedure: LAPAROSCOPIC CHOLECYSTECTOMY;  Surgeon: Rolm Bookbinder, MD;  Location: [place];  Service: General;  Laterality: N/A;    Current Outpatient Medications  Medication Sig Dispense Refill   loratadine (CLARITIN) 10 MG tablet Take 10 mg by mouth daily as needed for allergies.     naphazoline-glycerin (CLEAR EYES REDNESS) 0.012-0.2 % SOLN 1-2 drops 4 (four) times daily as needed for eye irritation.     Vitamin D, Ergocalciferol, (DRISDOL) 1.25 MG (50000 UNIT) CAPS capsule One capsule by mouth once a week for 8 weeks. Then take 1000IU/day 8 capsule 0   oxyCODONE (OXY IR/ROXICODONE) 5 MG immediate release tablet Take 1-2 tablets (5-10 mg total) by  mouth every 6 (six) hours as needed for moderate pain, severe pain or breakthrough pain. (Patient not taking: Reported on 10/03/2022) 10 tablet 0   No current facility-administered medications for this visit.    Family History  Problem Relation Age of Onset   Arthritis Mother    Diabetes Father    Hyperlipidemia Father    Hypertension Maternal Grandmother    Arthritis Maternal Grandmother    Diabetes Maternal Grandfather    Stroke Maternal Grandfather    Seizures Maternal Grandfather    Heart attack Maternal Grandfather    Early death Paternal Grandfather     Review of Systems  All other systems reviewed and are negative.   Exam:   BP 126/78 (BP Location: Left Arm, Patient Position: Sitting, Cuff Size: Normal)   Pulse 84   Ht 4' 11.5" (1.511 m)   Wt 161 lb (73 kg)   LMP 10/03/2022   SpO2 100%   BMI 31.97 kg/m     General appearance: alert, cooperative and appears stated age  Pelvic: External genitalia:  no lesions              No abnormal inguinal nodes palpated.              Urethra:  normal appearing urethra with no masses, tenderness or lesions              Bartholins and Skenes: normal  Vagina: normal appearing vagina with normal color and discharge, no lesions              Cervix: no lesions.  Menstrual flow noted.               Pap taken: no Bimanual Exam:  Uterus:  normal size, contour, position, consistency, mobility, non-tender              Adnexa: no mass, fullness, tenderness   Chaperone was present for exam:  Irving Burton  Assessment:     LGSIL pap and positive HR HPV.  Current menstruation.   Plan:   We had a comprehensive discussion regarding abnormal paps, HPV, colposcopy, LEEP procedures, and Gardasil vaccination.  She will start the Gardasil series today.  She will return for colposcopy when she is not on her menstrual period. Questions invited and answered.  35 min  total time was spent for this patient encounter, including  preparation, face-to-face counseling with the patient, coordination of care, and documentation of the encounter.  After visit summary provided.

## 2022-10-03 ENCOUNTER — Encounter: Payer: Self-pay | Admitting: Obstetrics and Gynecology

## 2022-10-03 ENCOUNTER — Ambulatory Visit: Payer: BC Managed Care – PPO | Admitting: Obstetrics and Gynecology

## 2022-10-03 VITALS — BP 126/78 | HR 84 | Ht 59.5 in | Wt 161.0 lb

## 2022-10-03 DIAGNOSIS — R87810 Cervical high risk human papillomavirus (HPV) DNA test positive: Secondary | ICD-10-CM

## 2022-10-03 DIAGNOSIS — Z23 Encounter for immunization: Secondary | ICD-10-CM

## 2022-10-03 DIAGNOSIS — R87612 Low grade squamous intraepithelial lesion on cytologic smear of cervix (LGSIL): Secondary | ICD-10-CM

## 2022-10-16 NOTE — Progress Notes (Signed)
GYNECOLOGY  VISIT   HPI: 26 y.o.   Single  Hispanic  female   No obstetric history on file. with Patient's last menstrual period was 10/03/2022.   here for   colpo   UPT -   neg   GYNECOLOGIC HISTORY: Patient's last menstrual period was 10/03/2022. Contraception:  condoms Menopausal hormone therapy:  n/a Last mammogram:  n/a Last pap smear:   08/13/22, LSIL: positive HR HPV         OB History   No obstetric history on file.        Patient Active Problem List   Diagnosis Date Noted   Encounter for annual physical exam 08/13/2022   Vitamin D deficiency 01/14/2020    History reviewed. No pertinent past medical history.  Past Surgical History:  Procedure Laterality Date   CHOLECYSTECTOMY N/A 12/24/2021   Procedure: LAPAROSCOPIC CHOLECYSTECTOMY;  Surgeon: Rolm Bookbinder, MD;  Location: [place] [place];  Service: General;  Laterality: N/A;    Current Outpatient Medications  Medication Sig Dispense Refill   loratadine (CLARITIN) 10 MG tablet Take 10 mg by mouth daily as needed for allergies.     naphazoline-glycerin (CLEAR EYES REDNESS) 0.012-0.2 % SOLN 1-2 drops 4 (four) times daily as needed for eye irritation.     Vitamin D, Ergocalciferol, (DRISDOL) 1.25 MG (50000 UNIT) CAPS capsule One capsule by mouth once a week for 8 weeks. Then take 1000IU/day 8 capsule 0   oxyCODONE (OXY IR/ROXICODONE) 5 MG immediate release tablet Take 1-2 tablets (5-10 mg total) by mouth every 6 (six) hours as needed for moderate pain, severe pain or breakthrough pain. (Patient not taking: Reported on 10/29/2022) 10 tablet 0   No current facility-administered medications for this visit.     ALLERGIES: Patient has no known allergies.  Family History  Problem Relation Age of Onset   Arthritis Mother    Diabetes Father    Hyperlipidemia Father    Hypertension Maternal Grandmother    Arthritis Maternal Grandmother    Diabetes Maternal Grandfather    Stroke Maternal Grandfather    Seizures Maternal  Grandfather    Heart attack Maternal Grandfather    Early death Paternal Grandfather     Social History   Socioeconomic History   Marital status: Single    Spouse name: Not on file   Number of children: Not on file   Years of education: Not on file   Highest education level: Not on file  Occupational History   Not on file  Tobacco Use   Smoking status: Former    Types: Cigarettes    Quit date: 03/21/2017    Years since quitting: 5.6   Smokeless tobacco: Never  Vaping Use   Vaping Use: Never used  Substance and Sexual Activity   Alcohol use: Yes    Comment: Occassionally   Drug use: Never   Sexual activity: Yes    Birth control/protection: Condom  Other Topics Concern   Not on file  Social History Narrative   ** Merged History Encounter **       Social Determinants of Health   Financial Resource Strain: Not on file  Food Insecurity: Not on file  Transportation Needs: Not on file  Physical Activity: Not on file  Stress: Not on file  Social Connections: Not on file  Intimate Partner Violence: Not on file    Review of Systems  All other systems reviewed and are negative.   PHYSICAL EXAMINATION:    BP 118/78 (BP Location: Left Arm,  Patient Position: Sitting, Cuff Size: Normal)   Pulse 72   Ht 4' 11.5" (1.511 m)   Wt 160 lb (72.6 kg)   LMP 10/03/2022   SpO2 98%   BMI 31.78 kg/m     General appearance: alert, cooperative and appears stated age   Colposcopy - cervix, vagina Consent for procedure.  3% acetic acid used in vagina and on cervix White light and green light filter used.  Colposcopy satisfactory:  Yes   __x___          No    _____ Findings:    Cervix:  subtle acetowhite change at 12:00 and 6:00 Vagina: no lesions.  Biopsies:   ECC, biopsy 6:00, biopsy 12:00 Monsel's placed.  Minimal EBL. No complications.   Chaperone was present for exam:  Emily  ASSESSMENT  LGSIL, positive HR HPV.   PLAN  FU biopsy results.  We discussed LEEP for  CIN II or CIN III,  Will do pap and HR HPV testing in 1 year for atypia or LGSIL. Questions invited and answered.  An After Visit Summary was printed and given to the patient.

## 2022-10-29 ENCOUNTER — Encounter: Payer: Self-pay | Admitting: Obstetrics and Gynecology

## 2022-10-29 ENCOUNTER — Other Ambulatory Visit (HOSPITAL_COMMUNITY)
Admission: RE | Admit: 2022-10-29 | Discharge: 2022-10-29 | Disposition: A | Discharge status: Home / Self Care | Payer: BC Managed Care – PPO | Source: Ambulatory Visit | Attending: Obstetrics and Gynecology | Admitting: Obstetrics and Gynecology

## 2022-10-29 ENCOUNTER — Ambulatory Visit (INDEPENDENT_AMBULATORY_CARE_PROVIDER_SITE_OTHER): Payer: BC Managed Care – PPO | Admitting: Obstetrics and Gynecology

## 2022-10-29 VITALS — BP 118/78 | HR 72 | Ht 59.5 in | Wt 160.0 lb

## 2022-10-29 DIAGNOSIS — R87810 Cervical high risk human papillomavirus (HPV) DNA test positive: Secondary | ICD-10-CM | POA: Diagnosis not present

## 2022-10-29 DIAGNOSIS — R87612 Low grade squamous intraepithelial lesion on cytologic smear of cervix (LGSIL): Secondary | ICD-10-CM | POA: Diagnosis not present

## 2022-10-29 DIAGNOSIS — Z01812 Encounter for preprocedural laboratory examination: Secondary | ICD-10-CM

## 2022-10-29 LAB — PREGNANCY, URINE: Preg Test, Ur: NEGATIVE

## 2022-10-29 NOTE — Patient Instructions (Signed)
Colposcopy, Care After  The following information offers guidance on how to care for yourself after your procedure. Your health care provider may also give you more specific instructions. If you have problems or questions, contact your health care provider. What can I expect after the procedure? If you had a colposcopy without a biopsy, you can expect to feel fine right away after your procedure. However, you may have some spotting of blood for a few days. You can return to your normal activities. If you had a colposcopy with a biopsy, it is common after the procedure to have: Soreness and mild pain. These may last for a few days. Mild vaginal bleeding or discharge that is dark-colored and grainy. This may last for a few days. The discharge may be caused by a liquid (solution) that was used during the procedure. You may need to wear a sanitary pad during this time. Spotting of blood for at least 48 hours after the procedure. Follow these instructions at home: Medicines Take over-the-counter and prescription medicines only as told by your health care provider. Talk with your health care provider about what type of over-the-counter pain medicines and prescription medicines you can start to take again. It is especially important to talk with your health care provider if you take blood thinners. Activity Avoid using douche products, using tampons, and having sex for at least 3 days after the procedure or for as long as told by your health care provider. Return to your normal activities as told by your health care provider. Ask your health care provider what activities are safe for you. General instructions Ask your health care provider if you may take baths, swim, or use a hot tub. You may take showers. If you use birth control (contraception), continue to use it. Keep all follow-up visits. This is important. Contact a health care provider if: You have a fever or chills. You faint or feel  light-headed. Get help right away if: You have heavy bleeding from your vagina or pass blood clots. Heavy bleeding is bleeding that soaks through a sanitary pad in less than 1 hour. You have vaginal discharge that is abnormal, is yellow in color, or smells bad. This could be a sign of infection. You have severe pain or cramps in your lower abdomen that do not go away with medicine. Summary If you had a colposcopy without a biopsy, you can expect to feel fine right away, but you may have some spotting of blood for a few days. You can return to your normal activities. If you had a colposcopy with a biopsy, it is common to have mild pain for a few days and spotting for 48 hours after the procedure. Avoid using douche products, using tampons, and having sex for at least 3 days after the procedure or for as long as told by your health care provider. Get help right away if you have heavy bleeding, severe pain, or signs of infection. This information is not intended to replace advice given to you by your health care provider. Make sure you discuss any questions you have with your health care provider. Document Revised: 03/04/2021 Document Reviewed: 03/04/2021 Elsevier Patient Education  2023 Elsevier Inc.  

## 2022-10-31 LAB — SURGICAL PATHOLOGY

## 2023-05-30 DIAGNOSIS — R300 Dysuria: Secondary | ICD-10-CM | POA: Diagnosis not present

## 2023-05-30 DIAGNOSIS — Z6830 Body mass index (BMI) 30.0-30.9, adult: Secondary | ICD-10-CM | POA: Diagnosis not present

## 2023-10-20 NOTE — Progress Notes (Signed)
 GYNECOLOGY  VISIT   HPI: 27 y.o.   Single  Hispanic female   No obstetric history on file. with Patient's last menstrual period was 10/26/2023.   here for: [facility] consult and pap repeat     Needs pregnancy prevention.   Periods have been irregular.  Menses regular until 2025. Variation was up to every 76 days. She has used Plan B 3 - 4 times in the last year.  Had 10 menses in 2024.    Menses last 4 days.  Heavy first 2 days and then last 2 days light.  Moderate cramping.   Condoms use off an on.  No prior use of birth control.   Denies HTN, liver or breast disease, migraine with aura, personal or family history of thromboembolic events.   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  GYNECOLOGIC HISTORY: Patient's last menstrual period was 10/26/2023. Contraception:  condoms Menopausal hormone therapy:  n/a Last 2 paps:  08/13/22, LSIL: positive HR HPV. Colposcopy 10/29/22 - LGSIL on bx and ECC benign.   History of abnormal Pap or positive HPV:  yes Mammogram:  n/a Gardasil:  one vaccine        OB History   No obstetric history on file.        Patient Active Problem List   Diagnosis Date Noted   Encounter for annual physical exam 08/13/2022   Vitamin D  deficiency 01/14/2020    History reviewed. No pertinent past medical history.  Past Surgical History:  Procedure Laterality Date   CHOLECYSTECTOMY N/A 12/24/2021   Procedure: LAPAROSCOPIC CHOLECYSTECTOMY;  Surgeon: Ebbie Cough, MD;  Location: [facility];  Service: General;  Laterality: N/A;    Current Outpatient Medications  Medication Sig Dispense Refill   loratadine (CLARITIN) 10 MG tablet Take 10 mg by mouth daily as needed for allergies.     naphazoline-glycerin (CLEAR EYES REDNESS) 0.012-0.2 % SOLN 1-2 drops 4 (four) times daily as needed for eye irritation.     Vitamin D , Ergocalciferol , (DRISDOL ) 1.25 MG (50000 UNIT) CAPS capsule One capsule by mouth once a week for 8 weeks. Then take 1000IU/day 8 capsule 0   No  current facility-administered medications for this visit.     ALLERGIES: Patient has no known allergies.  Family History  Problem Relation Age of Onset   Arthritis Mother    Diabetes Father    Hyperlipidemia Father    Hypertension Maternal Grandmother    Arthritis Maternal Grandmother    Diabetes Maternal Grandfather    Stroke Maternal Grandfather    Seizures Maternal Grandfather    Heart attack Maternal Grandfather    Early death Paternal Grandfather     Social History   Socioeconomic History   Marital status: Single    Spouse name: Not on file   Number of children: Not on file   Years of education: Not on file   Highest education level: Not on file  Occupational History   Not on file  Tobacco Use   Smoking status: Former    Current packs/day: 0.00    Types: Cigarettes    Quit date: 03/21/2017    Years since quitting: 6.6   Smokeless tobacco: Never  Vaping Use   Vaping status: Never Used  Substance and Sexual Activity   Alcohol use: Yes    Comment: Occassionally   Drug use: Never   Sexual activity: Yes    Birth control/protection: Condom  Other Topics Concern   Not on file  Social History Narrative   **  Merged History Encounter **       Social Drivers of Health   Financial Resource Strain: Low Risk  (05/30/2023)   Received from [facility]   Financial Resource Strain    How hard is it for you to pay for the very basics like food, housing, medical care, and heating?: Not hard at all    Unemplyed and requesting resources: Not on file  Food Insecurity: No Food Insecurity (05/30/2023)   Received from [facility]   Hunger Vital Sign    Worried About Running Out of Food in the Last Year: Never true    Ran Out of Food in the Last Year: Never true  Transportation Needs: No Transportation Needs (05/30/2023)   Received from [facility]   PRAPARE - Transportation    Lack of Transportation (Medical): No    Lack of Transportation  (Non-Medical): No  Physical Activity: Insufficiently Active (05/30/2023)   Received from [facility]   Exercise Vital Sign    Days of Exercise per Week: 3 days    Minutes of Exercise per Session: 30 min  Stress: No Stress Concern Present (05/30/2023)   Received from [facility]   [employer] of Occupational Health - Occupational Stress Questionnaire    Feeling of Stress : Only a little  Social Connections: Not on file (05/30/2023)  Intimate Partner Violence: Not At Risk (05/30/2023)   Received from [facility]   Humiliation, Afraid, Rape, and Kick questionnaire    Fear of Current or Ex-Partner: No    Emotionally Abused: No    Physically Abused: No    Sexually Abused: No    Review of Systems  All other systems reviewed and are negative.   PHYSICAL EXAMINATION:   BP 118/68 (BP Location: Left Arm, Patient Position: Sitting, Cuff Size: Small)   Ht 4' 11.5 (1.511 m)   Wt 145 lb (65.8 kg)   LMP 10/26/2023   BMI 28.80 kg/m     General appearance: alert, cooperative and appears stated age Head: Normocephalic, without obvious abnormality, atraumatic Neck: no adenopathy, supple, symmetrical, trachea midline and thyroid  normal to inspection and palpation Lungs: clear to auscultation bilaterally Breasts: normal appearance, no masses or tenderness, No nipple retraction or dimpling, No nipple discharge or bleeding, No axillary or supraclavicular adenopathy Heart: regular rate and rhythm Abdomen: soft, non-tender, no masses,  no organomegaly Extremities: extremities normal, atraumatic, no cyanosis or edema Skin: Skin color, texture, turgor normal. No rashes or lesions Lymph nodes: Cervical, supraclavicular, and axillary nodes normal. No abnormal inguinal nodes palpated Neurologic: Grossly normal  Pelvic: External genitalia:  no lesions              Urethra:  normal appearing urethra with no masses, tenderness or lesions              Bartholins  and Skenes: normal                 Vagina: normal appearing vagina with normal color and discharge, no lesions              Cervix: no lesions                Bimanual Exam:  Uterus:  normal size, contour, position, consistency, mobility, non-tender              Adnexa: no mass, fullness, tenderness           Chaperone was  present for exam:  Damien FALCON, CMA  ASSESSMENT:  Well woman with GYN exam.  Hx LGSIL.  Gardasil vaccination.  STD screening.  Contraceptive counseling.   PLAN:  Pap and HR HPV collected.  STD screening.  Contraceptive counseling.  We discussed pills, patch, ring, Nexplanon, IUDs, Depo Provera.  She will start LoEstrin 1.5/30.  Instructed in use.  #84, RF one. Warning signs and risk of stroke, DVT, PE, MI reviewed.  Gardasil #2 today.  FU in 4 months for pill recheck and Gardasil #3.  Also follow up yearly.

## 2023-11-03 ENCOUNTER — Other Ambulatory Visit (HOSPITAL_COMMUNITY)
Admission: RE | Admit: 2023-11-03 | Discharge: 2023-11-03 | Disposition: A | Discharge status: Home / Self Care | Payer: 59 | Source: Ambulatory Visit | Attending: Obstetrics and Gynecology | Admitting: Obstetrics and Gynecology

## 2023-11-03 ENCOUNTER — Encounter: Payer: Self-pay | Admitting: Obstetrics and Gynecology

## 2023-11-03 ENCOUNTER — Ambulatory Visit: Payer: 59 | Admitting: Obstetrics and Gynecology

## 2023-11-03 VITALS — BP 118/68 | Ht 59.5 in | Wt 145.0 lb

## 2023-11-03 DIAGNOSIS — Z124 Encounter for screening for malignant neoplasm of cervix: Secondary | ICD-10-CM | POA: Diagnosis present

## 2023-11-03 DIAGNOSIS — Z01419 Encounter for gynecological examination (general) (routine) without abnormal findings: Secondary | ICD-10-CM

## 2023-11-03 DIAGNOSIS — Z23 Encounter for immunization: Secondary | ICD-10-CM | POA: Diagnosis not present

## 2023-11-03 DIAGNOSIS — N870 Mild cervical dysplasia: Secondary | ICD-10-CM | POA: Diagnosis not present

## 2023-11-03 DIAGNOSIS — Z114 Encounter for screening for human immunodeficiency virus [HIV]: Secondary | ICD-10-CM | POA: Diagnosis not present

## 2023-11-03 DIAGNOSIS — Z113 Encounter for screening for infections with a predominantly sexual mode of transmission: Secondary | ICD-10-CM | POA: Diagnosis not present

## 2023-11-03 DIAGNOSIS — Z1159 Encounter for screening for other viral diseases: Secondary | ICD-10-CM

## 2023-11-03 MED ORDER — NORETHINDRONE ACET-ETHINYL EST 1.5-30 MG-MCG PO TABS: 1.0000 | ORAL_TABLET | Freq: Every day | ORAL | 0 refills | Status: DC

## 2023-11-03 MED ORDER — NORETHINDRONE ACET-ETHINYL EST 1.5-30 MG-MCG PO TABS: 1.0000 | ORAL_TABLET | Freq: Every day | ORAL | 1 refills | Status: DC

## 2023-11-03 NOTE — Patient Instructions (Addendum)
 Do a Sunday start for your birth control pills.   Oral Contraception Information You will learn how these birth control pills work, their different forms, and side effects. To view the content, go to this web address: [email or web address]  This video will expire on: 04/13/2025. If you need access to this video following this date, please reach out to the healthcare provider who assigned it to you. This information is not intended to replace advice given to you by your health care provider. Make sure you discuss any questions you have with your health care provider. Elsevier Patient Education  2024 Elsevier Inc.   EXERCISE AND DIET:  We recommended that you start or continue a regular exercise program for good health. Regular exercise means any activity that makes your heart beat faster and makes you sweat.  We recommend exercising at least 30 minutes per day at least 3 days a week, preferably 4 or 5.  We also recommend a diet low in fat and sugar.  Inactivity, poor dietary choices and obesity can cause diabetes, heart attack, stroke, and kidney damage, among others.    ALCOHOL AND SMOKING:  Women should limit their alcohol intake to no more than 7 drinks/beers/glasses of wine (combined, not each!) per week. Moderation of alcohol intake to this level decreases your risk of breast cancer and liver damage. And of course, no recreational drugs are part of a healthy lifestyle.  And absolutely no smoking or even second hand smoke. Most people know smoking can cause heart and lung diseases, but did you know it also contributes to weakening of your bones? Aging of your skin?  Yellowing of your teeth and nails?  CALCIUM AND VITAMIN D :  Adequate intake of calcium and Vitamin D  are recommended.  The recommendations for exact amounts of these supplements seem to change often, but generally speaking 600 mg of calcium (either carbonate or citrate) and 800 units of Vitamin D  per day seems prudent.  Certain women may benefit from higher intake of Vitamin D .  If you are among these women, your doctor will have told you during your visit.    PAP SMEARS:  Pap smears, to check for cervical cancer or precancers,  have traditionally been done yearly, although recent scientific advances have shown that most women can have pap smears less often.  However, every woman still should have a physical exam from her gynecologist every year. It will include a breast check, inspection of the vulva and vagina to check for abnormal growths or skin changes, a visual exam of the cervix, and then an exam to evaluate the size and shape of the uterus and ovaries.  And after 29 years of age, a rectal exam is indicated to check for rectal cancers. We will also provide age appropriate advice regarding health maintenance, like when you should have certain vaccines, screening for sexually transmitted diseases, bone density testing, colonoscopy, mammograms, etc.   MAMMOGRAMS:  All women over 73 years old should have a yearly mammogram. Many facilities now offer a 3D mammogram, which may cost around $50 extra out of pocket. If possible,  we recommend you accept the option to have the 3D mammogram performed.  It both reduces the number of women who will be called back for extra views which then turn out to be normal, and it is better than the routine mammogram at detecting truly abnormal areas.    COLONOSCOPY:  Colonoscopy to screen for colon cancer is recommended for all women at age 43.  We know, you hate the idea of the prep.  We agree, BUT, having colon cancer and not knowing it is worse!!  Colon cancer so often starts as a polyp that can be seen and removed at colonscopy, which can quite literally save your life!  And if your first colonoscopy is normal and you have no family history of colon cancer, most women don't have to have it again for 10 years.  Once every ten years, you can do something that may end up saving your life,  right?  We will be happy to help you get it scheduled when you are ready.  Be sure to check your insurance coverage so you understand how much it will cost.  It may be covered as a preventative service at no cost, but you should check your particular policy.

## 2023-11-04 LAB — RPR: RPR Ser Ql: NONREACTIVE

## 2023-11-04 LAB — HEPATITIS C ANTIBODY: Hepatitis C Ab: NONREACTIVE

## 2023-11-04 LAB — HIV ANTIBODY (ROUTINE TESTING W REFLEX): HIV 1&2 Ab, 4th Generation: NONREACTIVE

## 2023-11-05 ENCOUNTER — Other Ambulatory Visit: Payer: Self-pay | Admitting: Obstetrics and Gynecology

## 2023-11-05 ENCOUNTER — Encounter: Payer: Self-pay | Admitting: Obstetrics and Gynecology

## 2023-11-05 DIAGNOSIS — R87612 Low grade squamous intraepithelial lesion on cytologic smear of cervix (LGSIL): Secondary | ICD-10-CM

## 2023-11-05 DIAGNOSIS — R87810 Cervical high risk human papillomavirus (HPV) DNA test positive: Secondary | ICD-10-CM

## 2023-11-05 LAB — CYTOLOGY - PAP
Adequacy: ABSENT
Chlamydia: NEGATIVE
Comment: NEGATIVE
Comment: NEGATIVE
Comment: NEGATIVE
Comment: NORMAL
High risk HPV: POSITIVE — AB
Neisseria Gonorrhea: NEGATIVE
Trichomonas: NEGATIVE

## 2023-11-06 ENCOUNTER — Other Ambulatory Visit: Payer: Self-pay | Admitting: Obstetrics and Gynecology

## 2023-11-06 DIAGNOSIS — R87612 Low grade squamous intraepithelial lesion on cytologic smear of cervix (LGSIL): Secondary | ICD-10-CM

## 2023-11-06 DIAGNOSIS — R87810 Cervical high risk human papillomavirus (HPV) DNA test positive: Secondary | ICD-10-CM

## 2023-11-07 NOTE — Telephone Encounter (Signed)
Pt has read BS's recommendations. Msg sent to appt desk to schedule.

## 2024-02-03 IMAGING — US US ABDOMEN LIMITED
1 series · 14 of 25 positions shown · non-contrast
Comparison: None.

CLINICAL DATA: Right upper quadrant pain. Recent mononucleosis
causing elevated liver function tests.

EXAM:
ULTRASOUND ABDOMEN LIMITED RIGHT UPPER QUADRANT

[Series 1: us abdomen limited · 0.22mm/px · 14 of 53 slices shown]
[im 1/53]
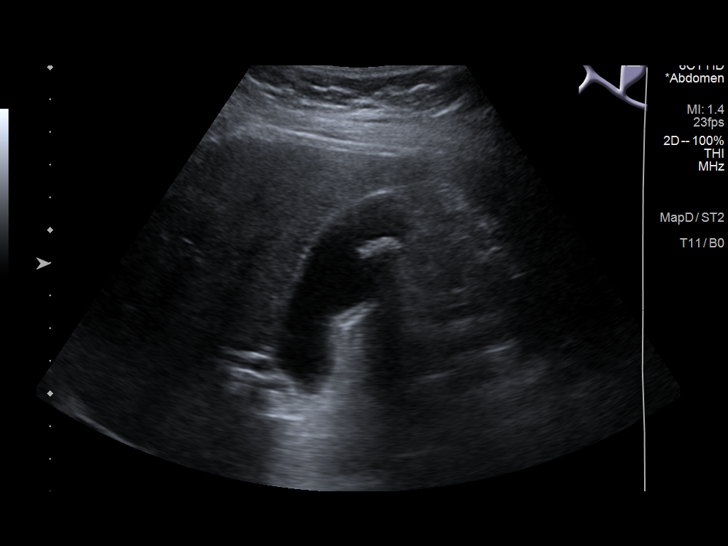
[im 5/53]
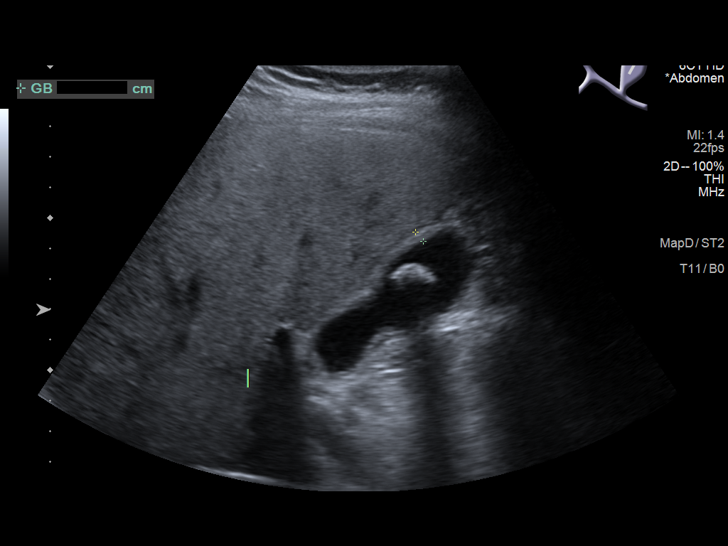
[im 9/53]
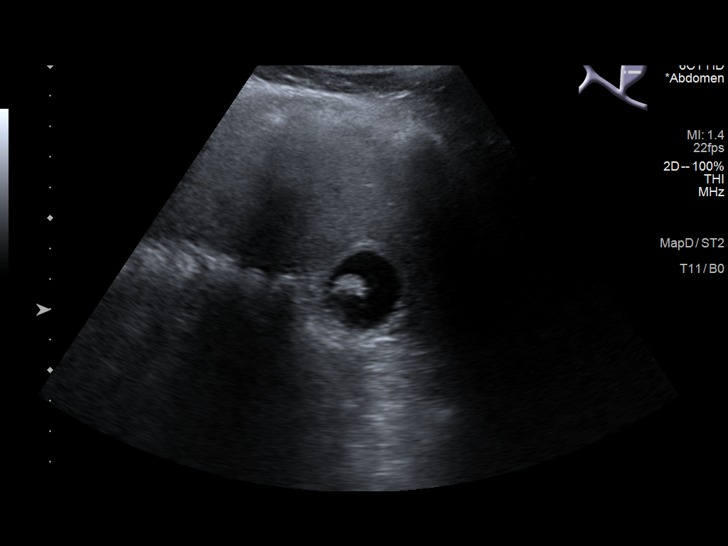
[im 14/53]
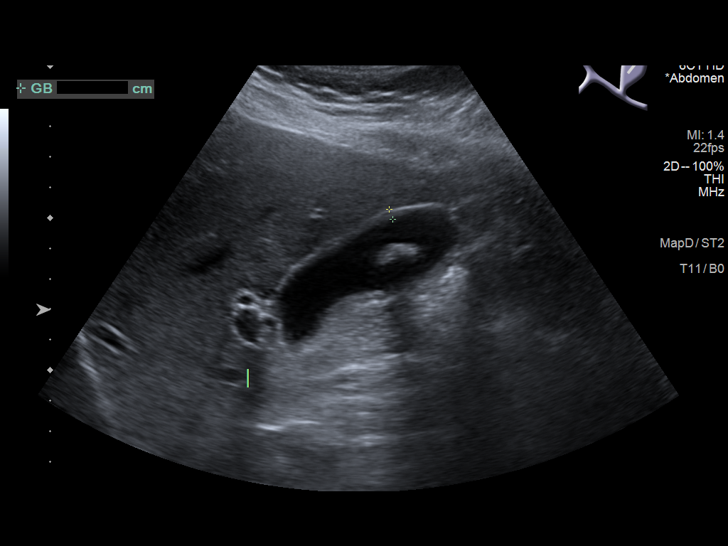
[im 18/53]
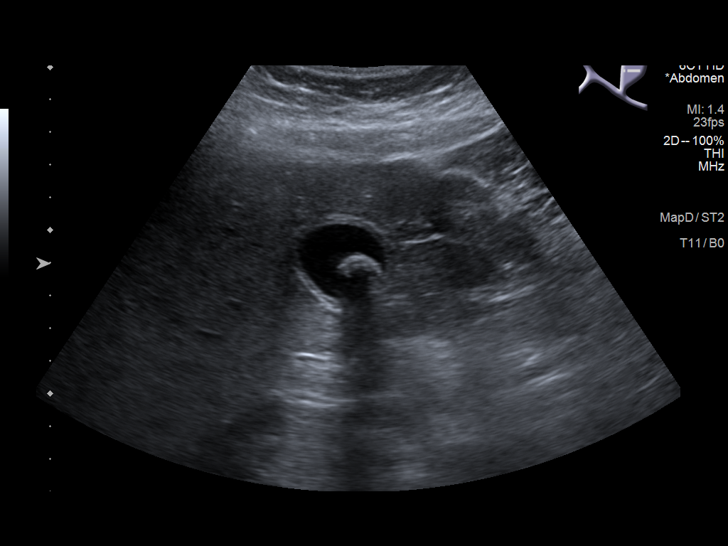
[im 20/53]
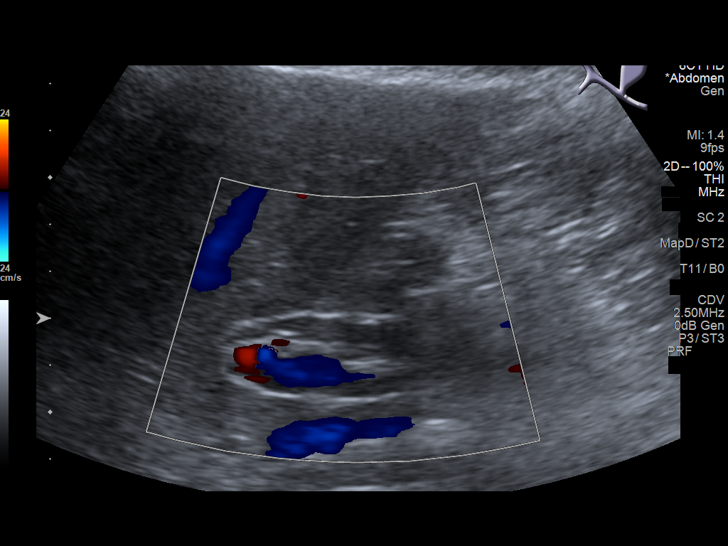
[im 24/53]
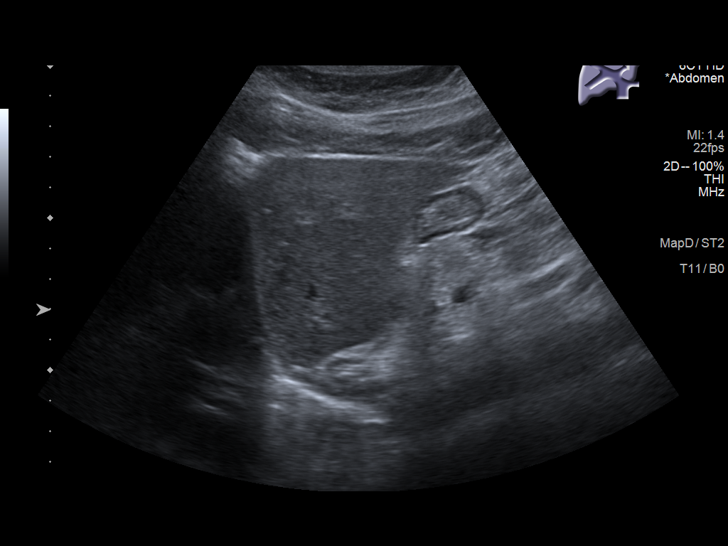
[im 29/53]
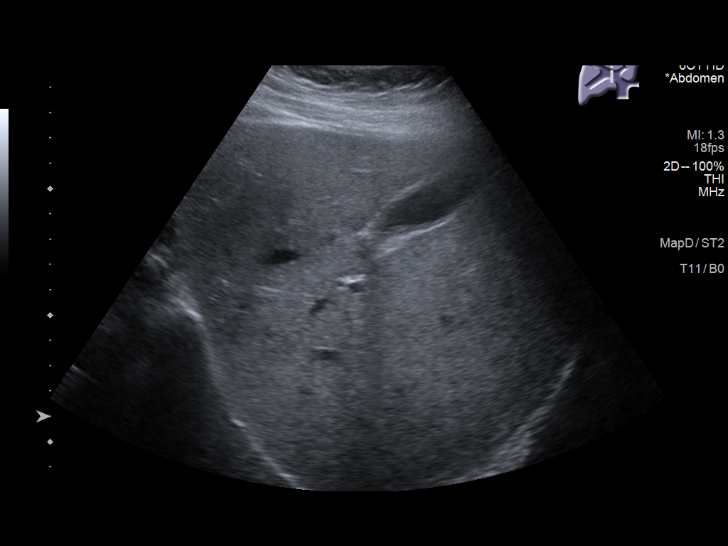
[im 33/53]
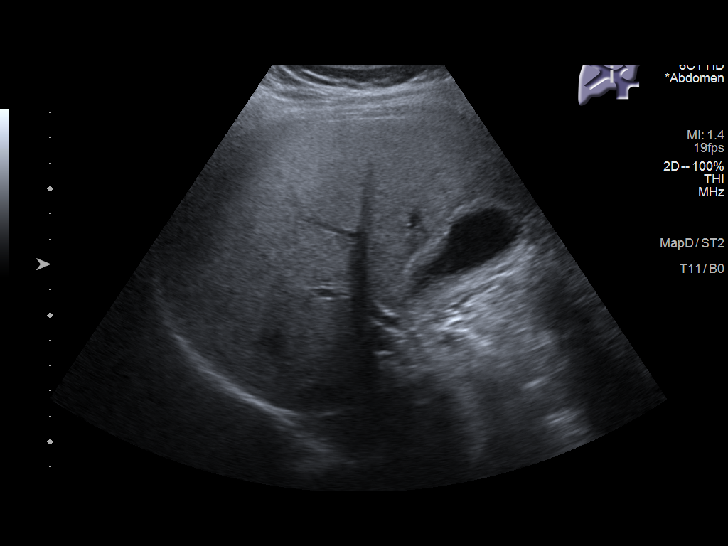
[im 35/53]
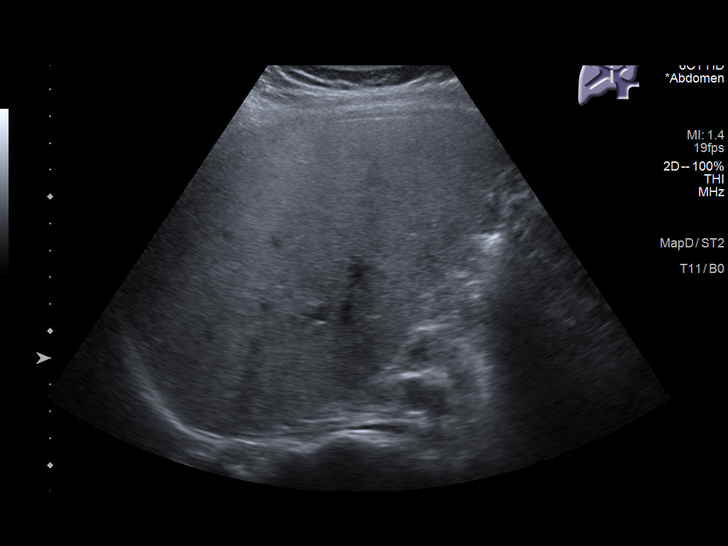
[im 40/53]
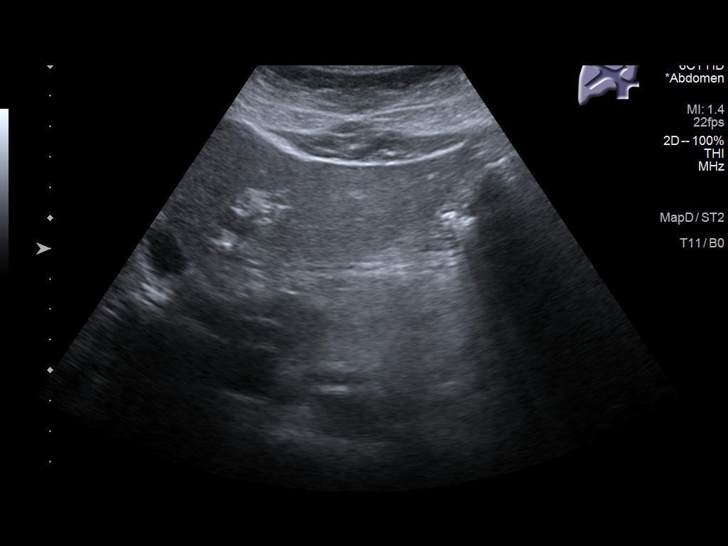
[im 44/53]
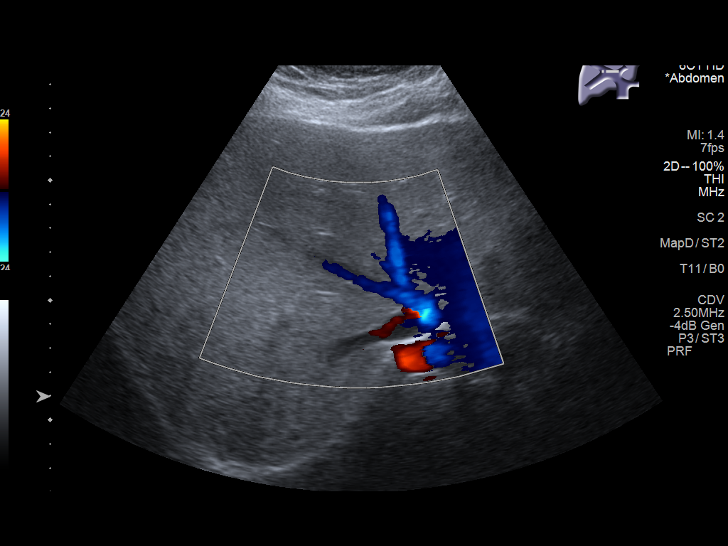
[im 48/53]
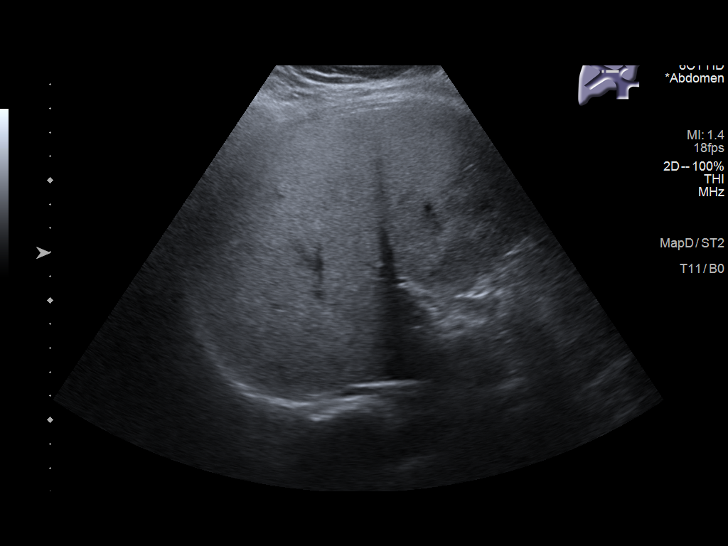
[im 53/53]
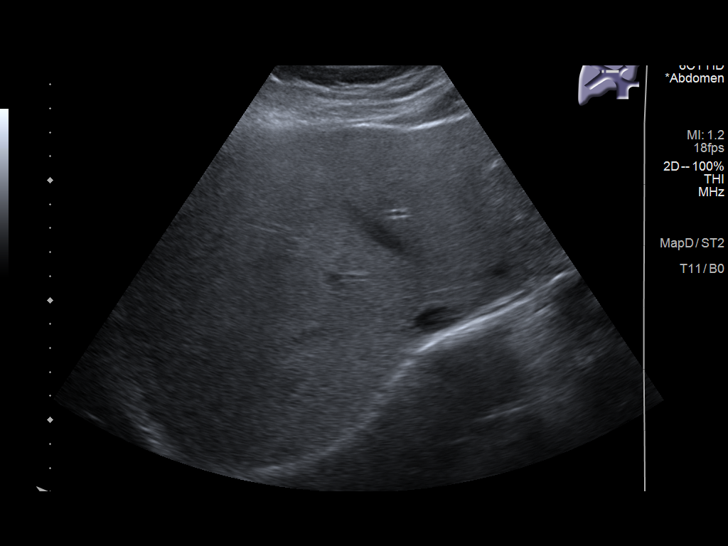

[14 of 25 positions shown; findings below may reference images not displayed]

FINDINGS: Gallbladder:

There is an echogenic shadowing gallstone measuring 1.5 cm. No
sonographic Murphy's sign. The gallbladder wall measures up to
cm at the upper limits of normal. No pericholecystic fluid is seen.

Common bile duct:

Diameter: 4 mm within normal limits.

Liver:

Increased echogenicity and attenuation as can be seen with fatty
infiltration of the liver. Smooth liver contours. Portal vein is
patent on color Doppler imaging with normal direction of blood flow
towards the liver.

Other: None.
IMPRESSION: :
IMPRESSION: 1. There is cholelithiasis with the gallbladder wall at the upper
limits of normal thickness. No pericholecystic fluid or sonographic
Murphy's sign to specifically indicate sonographic evidence of acute
cholecystitis.
2. Fatty infiltration of the liver.

## 2024-03-01 NOTE — Progress Notes (Unsigned)
 GYNECOLOGY  VISIT   HPI: 28 y.o.   Single  Hispanic female   No obstetric history on file. with Patient's last menstrual period was 02/17/2024 (exact date).   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   Taking her birth control pills.  Can take a pill late occasionally, but not missed.  Had nausea at first. Having some frequent headaches, and they are lessening.  3/10 scale.  Ibuprofen helps, and HA lasts one hour.   UPT negative.  GYNECOLOGIC HISTORY: Patient's last menstrual period was 02/17/2024 (exact date). Contraception:  OCP Menopausal hormone therapy:  n/a Last 2 paps:  11/03/23 LSIL HR HPV +, 08/13/22 LSIL HR HPV + History of abnormal Pap or positive HPV:  yes Mammogram:  n/a        OB History   No obstetric history on file.        Patient Active Problem List   Diagnosis Date Noted   Encounter for annual physical exam 08/13/2022   Vitamin D  deficiency 01/14/2020    History reviewed. No pertinent past medical history.  Past Surgical History:  Procedure Laterality Date   CHOLECYSTECTOMY N/A 12/24/2021   Procedure: LAPAROSCOPIC CHOLECYSTECTOMY;  Surgeon: Enid Harry, MD;  Location: [facility] OR;  Service: General;  Laterality: N/A;    Current Outpatient Medications  Medication Sig Dispense Refill   loratadine (CLARITIN) 10 MG tablet Take 10 mg by mouth daily as needed for allergies.     naphazoline-glycerin (CLEAR EYES REDNESS) 0.012-0.2 % SOLN 1-2 drops 4 (four) times daily as needed for eye irritation.     Norethindrone  Acetate-Ethinyl Estradiol (LOESTRIN 1.5/30, 21,) 1.5-30 MG-MCG tablet Take 1 tablet by mouth daily. 84 tablet 1   Vitamin D , Ergocalciferol , (DRISDOL ) 1.25 MG (50000 UNIT) CAPS capsule One capsule by mouth once a week for 8 weeks. Then take 1000IU/day 8 capsule 0   No current facility-administered medications for this visit.     ALLERGIES:  Patient has no known allergies.  Family History  Problem Relation Age of Onset   Arthritis Mother    Diabetes Father    Hyperlipidemia Father    Hypertension Maternal Grandmother    Arthritis Maternal Grandmother    Diabetes Maternal Grandfather    Stroke Maternal Grandfather    Seizures Maternal Grandfather    Heart attack Maternal Grandfather    Early death Paternal Grandfather     Social History   Socioeconomic History   Marital status: Single    Spouse name: Not on file   Number of children: Not on file   Years of education: Not on file   Highest education level: Not on file  Occupational History   Not on file  Tobacco Use   Smoking status: Former    Current packs/day: 0.00    Types: Cigarettes    Quit date: 03/21/2017    Years since quitting: 6.9   Smokeless tobacco: Never  Vaping Use   Vaping status: Never Used  Substance and Sexual Activity   Alcohol use: Yes    Comment: Occassionally   Drug use: Never   Sexual activity: Yes    Birth control/protection: Condom, OCP  Other Topics Concern   Not on file  Social History Narrative   ** Merged History Encounter **       Social Drivers of Health   Financial Resource Strain: Low Risk  (05/30/2023)  Received from [facility]   Financial Resource Strain    How hard is it for you to pay for the very basics like food, housing, medical care, and heating?: Not hard at all    Unemplyed and requesting resources: Not on file  Food Insecurity: No Food Insecurity (05/30/2023)   Received from [facility]   Hunger Vital Sign    Worried About Running Out of Food in the Last Year: Never true    Ran Out of Food in the Last Year: Never true  Transportation Needs: No Transportation Needs (05/30/2023)   Received from [facility]   PRAPARE - Transportation    Lack of Transportation (Medical): No    Lack of Transportation (Non-Medical): No  Physical Activity: Insufficiently Active  (05/30/2023)   Received from [facility]   Exercise Vital Sign    Days of Exercise per Week: 3 days    Minutes of Exercise per Session: 30 min  Stress: No Stress Concern Present (05/30/2023)   Received from [facility]   [employer] of Occupational Health - Occupational Stress Questionnaire    Feeling of Stress : Only a little  Social Connections: Not on file (05/30/2023)  Intimate Partner Violence: Not At Risk (05/30/2023)   Received from [facility]   Humiliation, Afraid, Rape, and Kick questionnaire    Fear of Current or Ex-Partner: No    Emotionally Abused: No    Physically Abused: No    Sexually Abused: No    Review of Systems  All other systems reviewed and are negative.   PHYSICAL EXAMINATION:   BP 112/76 (BP Location: Left Arm, Patient Position: Sitting, Cuff Size: Normal)   Pulse 68   LMP 02/17/2024 (Exact Date)   SpO2 99%     General appearance: alert, cooperative and appears stated age  Colposcopy - cervix, vagina,. Consent for procedure.  Time out done.  3% acetic acid used in vagina and on cervix White light and green light filter used.  Colposcopy satisfactory:  Yes   ___x__          No    _____ Findings:    Cervix:  subtle ring of acetowhite change around os, except at 9:00.  Thickened change at 3:00.  Increased vascularity at 1:00.  Vagina: Biopsies:  ECC, biopsy at 3:00 and 1:00 Monsel's placed.  Minimal EBL. No complications.  Chaperone was present for exam:  Cottie Diss, CMA  ASSESSMENT:  Pap LGSIL, positive HR HPV.  Birth control pill surveillance.   PLAN:  Follow up biopsy results.  Lower birth control dosage to LoEstrin 1/20.  #82, RF 2.  Pap and HR HPV in one year for results showing atypia or LGSIL.  LEEP for moderate or high grade dysplasia.   10 min  total time was spent for this patient encounter, including preparation, face-to-face counseling with the patient, coordination of care, and  documentation of the encounter.

## 2024-03-02 ENCOUNTER — Ambulatory Visit (INDEPENDENT_AMBULATORY_CARE_PROVIDER_SITE_OTHER): Payer: 59 | Admitting: Obstetrics and Gynecology

## 2024-03-02 ENCOUNTER — Other Ambulatory Visit (HOSPITAL_COMMUNITY)
Admission: RE | Admit: 2024-03-02 | Discharge: 2024-03-02 | Disposition: A | Discharge status: Home / Self Care | Source: Ambulatory Visit | Attending: Obstetrics and Gynecology | Admitting: Obstetrics and Gynecology

## 2024-03-02 ENCOUNTER — Encounter: Payer: Self-pay | Admitting: Obstetrics and Gynecology

## 2024-03-02 VITALS — BP 112/76 | HR 68

## 2024-03-02 DIAGNOSIS — Z01812 Encounter for preprocedural laboratory examination: Secondary | ICD-10-CM | POA: Diagnosis not present

## 2024-03-02 DIAGNOSIS — Z23 Encounter for immunization: Secondary | ICD-10-CM

## 2024-03-02 DIAGNOSIS — R87810 Cervical high risk human papillomavirus (HPV) DNA test positive: Secondary | ICD-10-CM | POA: Insufficient documentation

## 2024-03-02 DIAGNOSIS — R87612 Low grade squamous intraepithelial lesion on cytologic smear of cervix (LGSIL): Secondary | ICD-10-CM | POA: Insufficient documentation

## 2024-03-02 DIAGNOSIS — Z3041 Encounter for surveillance of contraceptive pills: Secondary | ICD-10-CM | POA: Diagnosis not present

## 2024-03-02 LAB — PREGNANCY, URINE: Preg Test, Ur: NEGATIVE

## 2024-03-02 MED ORDER — NORETHINDRONE ACET-ETHINYL EST 1-20 MG-MCG PO TABS: 1.0000 | ORAL_TABLET | Freq: Every day | ORAL | 2 refills | Status: DC

## 2024-03-02 NOTE — Addendum Note (Signed)
 Addended by: Bernett Brill on: 03/02/2024 09:59 AM   Modules accepted: Orders

## 2024-03-02 NOTE — Patient Instructions (Signed)
 Colposcopy, Care After  The following information offers guidance on how to care for yourself after your procedure. Your health care provider may also give you more specific instructions. If you have problems or questions, contact your health care provider. What can I expect after the procedure? If you had a colposcopy without a biopsy, you can expect to feel fine right away after your procedure. However, you may have some spotting of blood for a few days. You can return to your normal activities. If you had a colposcopy with a biopsy, it is common after the procedure to have: Soreness and mild pain. These may last for a few days. Mild vaginal bleeding or discharge that is dark-colored and grainy. This may last for a few days. The discharge may be caused by a liquid (solution) that was used during the procedure. You may need to wear a sanitary pad during this time. Spotting of blood for at least 48 hours after the procedure. Follow these instructions at home: Medicines Take over-the-counter and prescription medicines only as told by your health care provider. Talk with your health care provider about what type of over-the-counter pain medicines and prescription medicines you can start to take again. It is especially important to talk with your health care provider if you take blood thinners. Activity Avoid using douche products, using tampons, and having sex for at least 3 days after the procedure or for as long as told by your health care provider. Return to your normal activities as told by your health care provider. Ask your health care provider what activities are safe for you. General instructions Ask your health care provider if you may take baths, swim, or use a hot tub. You may take showers. If you use birth control (contraception), continue to use it. Keep all follow-up visits. This is important. Contact a health care provider if: You have a fever or chills. You faint or feel  light-headed. Get help right away if: You have heavy bleeding from your vagina or pass blood clots. Heavy bleeding is bleeding that soaks through a sanitary pad in less than 1 hour. You have vaginal discharge that is abnormal, is yellow in color, or smells bad. This could be a sign of infection. You have severe pain or cramps in your lower abdomen that do not go away with medicine. Summary If you had a colposcopy without a biopsy, you can expect to feel fine right away, but you may have some spotting of blood for a few days. You can return to your normal activities. If you had a colposcopy with a biopsy, it is common to have mild pain for a few days and spotting for 48 hours after the procedure. Avoid using douche products, using tampons, and having sex for at least 3 days after the procedure or for as long as told by your health care provider. Get help right away if you have heavy bleeding, severe pain, or signs of infection. This information is not intended to replace advice given to you by your health care provider. Make sure you discuss any questions you have with your health care provider. Document Revised: 03/04/2021 Document Reviewed: 03/04/2021 Elsevier Patient Education  2024 [employer].

## 2024-03-03 ENCOUNTER — Ambulatory Visit: Payer: Self-pay | Admitting: Obstetrics and Gynecology

## 2024-03-03 LAB — CYTOLOGY - PAP
Comment: NEGATIVE
High risk HPV: POSITIVE — AB

## 2024-03-03 LAB — SURGICAL PATHOLOGY

## 2024-03-11 ENCOUNTER — Encounter: Payer: Self-pay | Admitting: Obstetrics and Gynecology

## 2024-03-12 ENCOUNTER — Other Ambulatory Visit: Payer: Self-pay | Admitting: Obstetrics and Gynecology

## 2024-03-12 NOTE — Telephone Encounter (Signed)
 Med refill request: Microgestin Last AEX: 11/03/23 Next AEX: 11/08/24 Last MMG (if hormonal med) n/a Refill authorized: Rx refused, patient no longer taking. Patient is now taking Loestrin as of 03/02/24. Routing to provider for review.

## 2024-07-02 ENCOUNTER — Ambulatory Visit: Admitting: Physician Assistant

## 2024-07-05 ENCOUNTER — Ambulatory Visit: Admitting: Physician Assistant

## 2024-07-05 ENCOUNTER — Encounter: Payer: Self-pay | Admitting: Physician Assistant

## 2024-07-05 VITALS — BP 116/78 | HR 62 | Temp 97.5°F | Ht 59.5 in | Wt 143.4 lb

## 2024-07-05 DIAGNOSIS — Z131 Encounter for screening for diabetes mellitus: Secondary | ICD-10-CM

## 2024-07-05 DIAGNOSIS — Z833 Family history of diabetes mellitus: Secondary | ICD-10-CM

## 2024-07-05 DIAGNOSIS — R350 Frequency of micturition: Secondary | ICD-10-CM | POA: Diagnosis not present

## 2024-07-05 LAB — POCT URINALYSIS DIPSTICK
Bilirubin, UA: NEGATIVE
Blood, UA: NEGATIVE
Glucose, UA: NEGATIVE
Ketones, UA: NEGATIVE
Leukocytes, UA: NEGATIVE
Nitrite, UA: NEGATIVE
Protein, UA: NEGATIVE
Spec Grav, UA: >=1.03 — AB (ref 1.010–1.025)
Urobilinogen, UA: 0.2 U/dL
pH, UA: 6 (ref 5.0–8.0)

## 2024-07-05 LAB — POCT GLYCOSYLATED HEMOGLOBIN (HGB A1C): Hemoglobin A1C: 5.1 % (ref 4.0–5.6)

## 2024-07-05 NOTE — Patient Instructions (Addendum)
  VISIT SUMMARY: Today, we discussed your increased urinary frequency and urgency that you've been experiencing over the past several months. We reviewed your symptoms, recent weight loss, and family history, and ruled out diabetes as a cause.  YOUR PLAN: URINARY FREQUENCY AND URGENCY: You have been experiencing frequent and urgent urination without pain or burning for several months. Possible causes include incomplete voiding syndrome, pelvic floor dysfunction, interstitial cystitis, or a low-grade urinary tract infection. -We will order a urine culture to check for a low-grade bacterial infection. -You will be referred to a urologist for a bladder scan and further evaluation. -Please reduce your caffeine intake to see if it helps with your symptoms. -If needed, we may consider referring you to pelvic floor physical therapy.     GREAT WORK WITH LIFESTYLE CHOICES :) You're doing awesome!                  Contains text generated by Abridge.                                 Contains text generated by Abridge.

## 2024-07-05 NOTE — Progress Notes (Signed)
 Patient ID: Katherine Morgan, female    DOB: [birth year], 28 y.o.   MRN: [id number]   Assessment & Plan:  Frequent urination -     POCT urinalysis dipstick -     Urine Culture -     Ambulatory referral to Urology  Family history of diabetes mellitus -     POCT glycosylated hemoglobin (Hb A1C)  Diabetes mellitus screening -     POCT glycosylated hemoglobin (Hb A1C)      Assessment and Plan Assessment & Plan Urinary frequency and urgency Chronic urinary frequency and urgency for several months without associated pain or burning. Differential diagnosis includes incomplete voiding syndrome, pelvic floor dysfunction, interstitial cystitis, and possible low-grade urinary tract infection. Recent weight loss noted, but A1c is 5.1%, ruling out diabetes. Caffeine intake may contribute to symptoms. - Order urine culture to check for low-grade bacterial infection. - Refer to urology for bladder scan and further evaluation. - Advise reduction of caffeine intake to assess impact on symptoms. - Consider referral to pelvic floor physical therapy if indicated.      Return in about 4 weeks (around 08/02/2024) for recheck/follow-up.    Subjective:    Chief Complaint  Patient presents with   Urinary Frequency    Pt c/o urinary frequency, present for over 6 months. Pt denies any other sx at this time.     Urinary Frequency  Associated symptoms include frequency.   Discussed the use of AI scribe software for clinical note transcription with the patient, who gave verbal consent to proceed.  History of Present Illness Katherine Morgan is a 28 year old female who presents with increased urinary frequency and urgency.  She has experienced increased urinary frequency and urgency over the past several months, needing to urinate every 30 minutes to an hour, even after recently emptying her bladder. This occurs despite limited fluid intake, which typically includes one bottle of water and  one Coke Zero during her work shift. The urgency worsens with caffeine intake, although she has significantly reduced her consumption of caffeinated beverages over the past year.  She consciously plans her fluid intake around bathroom availability, especially during road trips, where she needs to stop every two hours to urinate. She also wakes up at night to urinate and feels the need to urinate multiple times before leaving the house or after meals. There is no pain associated with urination, but she describes discomfort when trying to hold her urine.  She has lost significant weight over the past year, dropping from 160 pounds to 140 pounds, and has reduced her soda intake. Her brother has commented on her frequent urination. No burning sensation during urination, bladder pain, or stomach pain. She is concerned about the possibility of diabetes due to her symptoms and family history.  She has a history of gallbladder removal two years ago and notes that her bowel movements have become more frequent, especially after consuming oily foods.     No past medical history on file.  Past Surgical History:  Procedure Laterality Date   CHOLECYSTECTOMY N/A 12/24/2021   Procedure: LAPAROSCOPIC CHOLECYSTECTOMY;  Surgeon: Ebbie Cough, MD;  Location: [facility] OR;  Service: General;  Laterality: N/A;    Family History  Problem Relation Age of Onset   Arthritis Mother    Diabetes Father    Hyperlipidemia Father    Hypertension Maternal Grandmother    Arthritis Maternal Grandmother    Diabetes Maternal Grandfather    Stroke Maternal Grandfather  Seizures Maternal Grandfather    Heart attack Maternal Grandfather    Early death Paternal Grandfather     Social History   Tobacco Use   Smoking status: Former    Current packs/day: 0.00    Types: Cigarettes    Quit date: 03/21/2017    Years since quitting: 7.2   Smokeless tobacco: Never  Vaping Use   Vaping status: Never Used  Substance Use  Topics   Alcohol use: Yes    Comment: Occassionally   Drug use: Never     No Known Allergies  Review of Systems  Genitourinary:  Positive for frequency.   NEGATIVE UNLESS OTHERWISE INDICATED IN HPI      Objective:     BP 116/78 (BP Location: Left Arm, Patient Position: Sitting, Cuff Size: Large)   Pulse 62   Temp (!) 97.5 F (36.4 C) (Temporal)   Ht 4' 11.5 (1.511 m)   Wt 143 lb 6.4 oz (65 kg)   LMP 05/09/2024 (Exact Date)   SpO2 100%   BMI 28.48 kg/m   Wt Readings from Last 3 Encounters:  07/05/24 143 lb 6.4 oz (65 kg)  11/03/23 145 lb (65.8 kg)  10/29/22 160 lb (72.6 kg)    BP Readings from Last 3 Encounters:  07/05/24 116/78  03/02/24 112/76  11/03/23 118/68     Physical Exam Vitals and nursing note reviewed.  Constitutional:      General: She is not in acute distress.    Appearance: Normal appearance. She is not ill-appearing.  HENT:     Head: Normocephalic and atraumatic.  Cardiovascular:     Rate and Rhythm: Normal rate and regular rhythm.     Pulses: Normal pulses.     Heart sounds: Normal heart sounds.  Pulmonary:     Effort: Pulmonary effort is normal.     Breath sounds: Normal breath sounds.  Abdominal:     General: Abdomen is flat. Bowel sounds are normal.     Palpations: Abdomen is soft.     Tenderness: There is no right CVA tenderness or left CVA tenderness.  Skin:    General: Skin is warm and dry.  Neurological:     General: No focal deficit present.     Mental Status: She is alert.  Psychiatric:        Mood and Affect: Mood normal.             Anatalia Kronk M Marsean Elkhatib, PA-C

## 2024-07-28 ENCOUNTER — Ambulatory Visit: Admitting: Physician Assistant

## 2024-07-28 ENCOUNTER — Encounter: Payer: Self-pay | Admitting: Physician Assistant

## 2024-07-28 VITALS — BP 114/74 | HR 59 | Temp 97.6°F | Ht 59.5 in | Wt 145.6 lb

## 2024-07-28 DIAGNOSIS — R350 Frequency of micturition: Secondary | ICD-10-CM

## 2024-07-28 DIAGNOSIS — N3281 Overactive bladder: Secondary | ICD-10-CM

## 2024-07-28 NOTE — Progress Notes (Signed)
    Patient ID: Katherine Morgan, female    DOB: December 26, 1995, 28 y.o.   MRN: [id number]   Assessment & Plan:  There are no diagnoses linked to this encounter.    Assessment and Plan Assessment & Plan       No follow-ups on file.    Subjective:    Chief Complaint  Patient presents with   Medical Management of Chronic Issues    Pt in office for 4 wk f/u; pt has tried to cut back on caffeine, but still having frequent urination, but has noticed a slight decrease.      HPI Discussed the use of AI scribe software for clinical note transcription with the patient, who gave verbal consent to proceed.  History of Present Illness      History reviewed. No pertinent past medical history.  Past Surgical History:  Procedure Laterality Date   CHOLECYSTECTOMY N/A 12/24/2021   Procedure: LAPAROSCOPIC CHOLECYSTECTOMY;  Surgeon: Ebbie Cough, MD;  Location: [facility] OR;  Service: General;  Laterality: N/A;    Family History  Problem Relation Age of Onset   Arthritis Mother    Diabetes Father    Hyperlipidemia Father    Hypertension Maternal Grandmother    Arthritis Maternal Grandmother    Diabetes Maternal Grandfather    Stroke Maternal Grandfather    Seizures Maternal Grandfather    Heart attack Maternal Grandfather    Early death Paternal Grandfather     Social History   Tobacco Use   Smoking status: Former    Current packs/day: 0.00    Types: Cigarettes    Quit date: 03/21/2017    Years since quitting: 7.3   Smokeless tobacco: Never  Vaping Use   Vaping status: Never Used  Substance Use Topics   Alcohol use: Yes    Comment: Occassionally   Drug use: Never     No Known Allergies  Review of Systems NEGATIVE UNLESS OTHERWISE INDICATED IN HPI      Objective:     BP 114/74 (BP Location: Left Arm, Patient Position: Sitting, Cuff Size: Normal)   Pulse (!) 59   Temp 97.6 F (36.4 C) (Temporal)   Ht 4' 11.5 (1.511 m)   Wt 145 lb 9.6 oz (66 kg)   LMP  07/06/2024 (Exact Date)   SpO2 99%   BMI 28.92 kg/m   Wt Readings from Last 3 Encounters:  07/28/24 145 lb 9.6 oz (66 kg)  07/05/24 143 lb 6.4 oz (65 kg)  11/03/23 145 lb (65.8 kg)    BP Readings from Last 3 Encounters:  07/28/24 114/74  07/05/24 116/78  03/02/24 112/76     Physical Exam          Jayro Mcmath M Karrina Lye, PA-C

## 2024-07-28 NOTE — Patient Instructions (Addendum)
 Please call [phone] to make an appointment with [facility].

## 2024-09-29 ENCOUNTER — Ambulatory Visit: Attending: Physician Assistant

## 2024-09-29 ENCOUNTER — Other Ambulatory Visit: Payer: Self-pay

## 2024-09-29 DIAGNOSIS — N3281 Overactive bladder: Secondary | ICD-10-CM | POA: Insufficient documentation

## 2024-09-29 DIAGNOSIS — M6281 Muscle weakness (generalized): Secondary | ICD-10-CM | POA: Insufficient documentation

## 2024-09-29 DIAGNOSIS — R293 Abnormal posture: Secondary | ICD-10-CM

## 2024-09-29 DIAGNOSIS — N3941 Urge incontinence: Secondary | ICD-10-CM | POA: Insufficient documentation

## 2024-09-29 DIAGNOSIS — R350 Frequency of micturition: Secondary | ICD-10-CM | POA: Diagnosis present

## 2024-09-29 DIAGNOSIS — R279 Unspecified lack of coordination: Secondary | ICD-10-CM

## 2024-09-29 NOTE — Patient Instructions (Signed)
° °  Urge Drill  When you feel an urge to go, follow these steps to regain control: Stop what you are doing and be still Take one deep breath, directing your air into your abdomen Think an affirming thought, such as I've got this. Do 5 quick flicks of your pelvic floor Walk with control to the bathroom to void, or delay voiding    Double-voiding:  This technique is to help with post-void dribbling, or leaking a little bit when you stand up right after urinating. Use relaxed toileting mechanics to urinate as much as you feel like you have to without straining. Sit back upright from leaning forward and relax this way for 10-20 seconds. Lean forward again to finish voiding any amount more.    [facility] [address] [place], [place] [address] Phone # [phone] Fax [phone]

## 2024-09-29 NOTE — Therapy (Signed)
 OUTPATIENT PHYSICAL THERAPY FEMALE PELVIC EVALUATION   Patient Name: Katherine Morgan MRN: [id number] DOB:Jul 13, 1996, 28 y.o., female Today's Date: 09/29/2024  END OF SESSION:  PT End of Session - 09/29/24 1232     Visit Number 1    Date for Recertification  03/16/25    Authorization Type [employer]et[employer]    PT Start Time 1230    PT Stop Time 1310    PT Time Calculation (min) 40 min    Activity Tolerance Patient tolerated treatment well    Behavior During Therapy [facility] for tasks assessed/performed          History reviewed. No pertinent past medical history. Past Surgical History:  Procedure Laterality Date   CHOLECYSTECTOMY N/A 12/24/2021   Procedure: LAPAROSCOPIC CHOLECYSTECTOMY;  Surgeon: Ebbie Cough, MD;  Location: [facility] OR;  Service: General;  Laterality: N/A;   Patient Active Problem List   Diagnosis Date Noted   Encounter for annual physical exam 08/13/2022   Vitamin D  deficiency 01/14/2020    PCP: Allwardt, Mardy HERO, PA-C  REFERRING PROVIDER: Allwardt, Mardy HERO, PA-C  REFERRING DIAG: R35.0 (ICD-10-CM) - Frequent urination N32.81 (ICD-10-CM) - Overactive bladder  THERAPY DIAG:  Muscle weakness (generalized)  Unspecified lack of coordination  Abnormal posture  Urinary frequency  Urge incontinence  Rationale for Evaluation and Treatment: Rehabilitation  ONSET DATE: 6 months ago  SUBJECTIVE:                                                                                                                                                                                           SUBJECTIVE STATEMENT: Pt states that she has to urinate very often. She is starting to control her fluid intake in order to attempt urinating less often. She knows the caffeine makes this worse. She can urinate and then have to go again 10 minutes later at worst.    PAIN:  Are you having pain? No   PRECAUTIONS: None  RED FLAGS: None   WEIGHT BEARING RESTRICTIONS: No  FALLS:  Has  patient fallen in last 6 months? No  OCCUPATION: nurse - works night shift   ACTIVITY LEVEL : cardio on regular basis   PLOF: Independent  PATIENT GOALS: decrease urinary frequency/urgency  PERTINENT HISTORY:  Cholecystectomy 2023 Sexual abuse: No  BOWEL MOVEMENT: Pain with bowel movement: No Type of bowel movement:Type (Bristol Stool Scale) 4, Frequency multiple times a day since chole, and Strain no Fully empty rectum: Yes:   Leakage: No Urgency: No Pads: No Fiber supplement/laxative No  URINATION: Pain with urination: No Fully empty bladder: Yes: yes, but then will have to go back Stream: Strong Urgency:  Yes  Frequency: inconsistent, but at least 6-8x/day; she will feel like she can sometimes urinate 10 minutes after she went or 30 minutes and if she doesn't she'll start leaking - every 30-40 minutes on road trips  Nocturia: at least 1x Fluid Intake: coffee (knows this will make things worse);  Leakage: Urge to void and Walking to the bathroom Pads: No  INTERCOURSE:  Ability to have vaginal penetration Yes  Pain with intercourse: none DrynessNo Climax: WNL Marinoff Scale: 0/3 Lubricant: no  PREGNANCY: NA  PROLAPSE: None   OBJECTIVE:  Note: Objective measures were completed at Evaluation unless otherwise noted.  09/29/24 PATIENT SURVEYS:   PFIQ-7: 71 (UIQ-7)  COGNITION: Overall cognitive status: Within functional limits for tasks assessed     SENSATION: Light touch: Appears intact   FUNCTIONAL TESTS:  Squat: WNL Single leg stance:  Rt: pelvic drop   Lt: pelvic drop Curl-up test: WNL Sit-up test: 0/3   GAIT: Assistive device utilized: None Comments: WNL  POSTURE: rounded shoulders, forward head, decreased thoracic kyphosis, and elevated Rt iliac crest, Lt posterior rotation   LUMBARAROM/PROM:  A/PROM A/PROM  Eval (% available)  Flexion 50  Extension 100  Right lateral flexion 100  Left lateral flexion 100  Right rotation 100   Left rotation 100   (Blank rows = not tested)  PALPATION: General: WNL  Abdominal: Sternocostal angle: increased Breathing: apical Tenderness: none Scar tissue: cholecystectomy scar Diastasis: WNL                External Perineal Exam: WNL                             Internal Pelvic Floor: WNL  Patient confirms identification and approves PT to assess internal pelvic floor and treatment Yes  PELVIC MMT:   MMT eval  Vaginal 1-2/5 (inconsistent, 1 second hold, 9 poorly coordinated repetitions  (Blank rows = not tested)        TONE: WNL  PROLAPSE: Grade 1 anterior vaginal wall laxity   TODAY'S TREATMENT:                                                                                                                              DATE:  09/29/24 EVAL  Neuromuscular re-education: Pt provides verbal consent for internal vaginal/rectal pelvic floor exam. Internal vaginal pelvic floor muscle contraction training Quick flicks Long holds Urge drill  Therapeutic activities: Bladder journal  Double voiding     PATIENT EDUCATION:  Education details: See above Person educated: Patient Education method: Explanation, Demonstration, Tactile cues, Verbal cues, and Handouts Education comprehension: verbalized understanding  HOME EXERCISE PROGRAM: [id number]  ASSESSMENT:  CLINICAL IMPRESSION: Patient is a 28 y.o. female who was seen today for physical therapy evaluation and treatment for urinary frequency and urge incontinence. Exam findings notable for abnormal posture, core weakness, decreased lumbar flexion, mild anterior vaginal wall laxity, pelvic floor muscle  weakness and decreased endurance, poor pelvic floor muscle coordination. Signs and symptoms are most consistent with poor bladder habits, pelvic floor muscle weakness, and mild anterior vaginal wall laxity. Initial treatment consisted of pelvic floor muscle contraction training, double voiding, keeping bladder  journal, and urge drill. Pt will continue to benefit from skilled PT intervention in order to decrease urinary frequency/urge incontinence, address impairments, and improve quality of life.    OBJECTIVE IMPAIRMENTS: decreased activity tolerance, decreased coordination, decreased endurance, decreased mobility, decreased ROM, decreased strength, increased fascial restrictions, increased muscle spasms, impaired flexibility, impaired tone, improper body mechanics, postural dysfunction, and pain.   ACTIVITY LIMITATIONS: continence  PARTICIPATION LIMITATIONS: cleaning, laundry, driving, community activity, and occupation  PERSONAL FACTORS: 1 comorbidity: medical history are also affecting patient's functional outcome.   REHAB POTENTIAL: Good  CLINICAL DECISION MAKING: Stable/uncomplicated  EVALUATION COMPLEXITY: Low   GOALS: Goals reviewed with patient? Yes  SHORT TERM GOALS: Target date: 10/27/2024   Pt will be independent with HEP in order to improve activity tolerance.   Baseline: Goal status: INITIAL  2.  Pt will be independent with the knack, urge suppression technique, and double voiding in order to improve bladder habits and decrease urinary incontinence.    Baseline:  Goal status: INITIAL  3.  Pt will report no more than 1 trip to the bathroom in an hour in order to decrease stops with drive to [place].   Baseline: up to every 10 minutes  Goal status: INITIAL  4.  Patient will report 25% improvement in urinary incontinence in order to decrease pad use, risk of infection, and quality of life.   Baseline:  Goal status: INITIAL     LONG TERM GOALS: Target date: 03/16/2025  Pt will be independent with advanced HEP in order to improve activity tolerance.   Baseline:  Goal status: INITIAL  2.  Patient will report 75% improvement in urinary incontinence in order to decrease pad use, decrease risk of infection, and improve quality of life.   Baseline:  Goal status:  INITIAL  3.  Pt will be able to go 2-3 hours in between voids without urgency or incontinence in order to improve QOL and perform all functional activities with less difficulty.   Baseline:  Goal status: INITIAL  4.  Pt will demonstrate normal pelvic floor muscle tone and A/ROM, able to achieve 3/5 strength with contractions and 10 sec endurance, in order to reduce urinary leaking and number of pads patient wears.   Baseline:  Goal status: INITIAL  5.  Pt will improve UIQ-7 score to less than 25 in order to demonstrate decreased functional impairment due to urinary frequency and incontinence.  Baseline:   Goal status: INITIAL   PLAN:  PT FREQUENCY: 1-2x/week  PT DURATION: 10 visits    PLANNED INTERVENTIONS: 97164- PT Re-evaluation, 97110-Therapeutic exercises, 97530- Therapeutic activity, 97112- Neuromuscular re-education, 97535- Self Care, 02859- Manual therapy, [phone]- Gait training, [phone]- Aquatic Therapy, [phone]- Electrical stimulation (unattended), [phone]- Traction (mechanical), [id number]- Ionotophoresis 4mg /ml Dexamethasone , 79439 (1-2 muscles), 20561 (3+ muscles)- Dry Needling, Patient/Family education, Balance training, Taping, Joint mobilization, Joint manipulation, Spinal manipulation, Spinal mobilization, Scar mobilization, Vestibular training, Cryotherapy, Moist heat, and Biofeedback  PLAN FOR NEXT SESSION: core strengthening, bladder habits, review bladder journal   Josette Mares, PT, DPT12/10/251:24 PM [facility] [address] [place], [place] [address] Phone # [phone] Fax [phone]

## 2024-10-27 ENCOUNTER — Ambulatory Visit: Attending: Physician Assistant

## 2024-10-27 DIAGNOSIS — M6281 Muscle weakness (generalized): Secondary | ICD-10-CM | POA: Insufficient documentation

## 2024-10-27 DIAGNOSIS — R350 Frequency of micturition: Secondary | ICD-10-CM | POA: Diagnosis present

## 2024-10-27 DIAGNOSIS — N3941 Urge incontinence: Secondary | ICD-10-CM | POA: Diagnosis present

## 2024-10-27 DIAGNOSIS — R293 Abnormal posture: Secondary | ICD-10-CM | POA: Insufficient documentation

## 2024-10-27 DIAGNOSIS — R279 Unspecified lack of coordination: Secondary | ICD-10-CM | POA: Insufficient documentation

## 2024-10-27 NOTE — Therapy (Signed)
 " OUTPATIENT PHYSICAL THERAPY FEMALE PELVIC TREATMENT   Patient Name: Katherine Morgan MRN: [id number] DOB:May 09, 1996, 29 y.o., female Today's Date: 10/27/2024  END OF SESSION:  PT End of Session - 10/27/24 0808     Visit Number 2    Date for Recertification  03/16/25    Authorization Type Aet[employer]    PT Start Time 0803    PT Stop Time 0841    PT Time Calculation (min) 38 min    Activity Tolerance Patient tolerated treatment well    Behavior During Therapy [facility] for tasks assessed/performed          History reviewed. No pertinent past medical history. Past Surgical History:  Procedure Laterality Date   CHOLECYSTECTOMY N/A 12/24/2021   Procedure: LAPAROSCOPIC CHOLECYSTECTOMY;  Surgeon: Ebbie Cough, MD;  Location: [facility] OR;  Service: General;  Laterality: N/A;   Patient Active Problem List   Diagnosis Date Noted   Encounter for annual physical exam 08/13/2022   Vitamin D  deficiency 01/14/2020    PCP: Allwardt, Mardy HERO, PA-C  REFERRING PROVIDER: Allwardt, Mardy HERO, PA-C  REFERRING DIAG: R35.0 (ICD-10-CM) - Frequent urination N32.81 (ICD-10-CM) - Overactive bladder  THERAPY DIAG:  Muscle weakness (generalized)  Unspecified lack of coordination  Abnormal posture  Urinary frequency  Urge incontinence  Rationale for Evaluation and Treatment: Rehabilitation  ONSET DATE: 6 months ago  SUBJECTIVE:                                                                                                                                                                                           SUBJECTIVE STATEMENT: Pt has been trying to work on contractions regularly. She also did a journal for several days, but does not have it with her. She has learned about her habits more and working to realize that she does not have to always actually go.    PAIN:  Are you having pain? No   PRECAUTIONS: None  RED FLAGS: None   WEIGHT BEARING RESTRICTIONS: No  FALLS:  Has patient  fallen in last 6 months? No  OCCUPATION: nurse - works night shift   ACTIVITY LEVEL : cardio on regular basis   PLOF: Independent  PATIENT GOALS: decrease urinary frequency/urgency  PERTINENT HISTORY:  Cholecystectomy 2023 Sexual abuse: No  BOWEL MOVEMENT: Pain with bowel movement: No Type of bowel movement:Type (Bristol Stool Scale) 4, Frequency multiple times a day since chole, and Strain no Fully empty rectum: Yes:   Leakage: No Urgency: No Pads: No Fiber supplement/laxative No  URINATION: Pain with urination: No Fully empty bladder: Yes: yes, but then will have to go back Stream: Strong Urgency:  Yes  Frequency: inconsistent, but at least 6-8x/day; she will feel like she can sometimes urinate 10 minutes after she went or 30 minutes and if she doesn't she'll start leaking - every 30-40 minutes on road trips  Nocturia: at least 1x Fluid Intake: coffee (knows this will make things worse);  Leakage: Urge to void and Walking to the bathroom Pads: No  INTERCOURSE:  Ability to have vaginal penetration Yes  Pain with intercourse: none DrynessNo Climax: WNL Marinoff Scale: 0/3 Lubricant: no  PREGNANCY: NA  PROLAPSE: None   OBJECTIVE:  Note: Objective measures were completed at Evaluation unless otherwise noted.  09/29/24 PATIENT SURVEYS:   PFIQ-7: 71 (UIQ-7)  COGNITION: Overall cognitive status: Within functional limits for tasks assessed     SENSATION: Light touch: Appears intact   FUNCTIONAL TESTS:  Squat: WNL Single leg stance:  Rt: pelvic drop   Lt: pelvic drop Curl-up test: WNL Sit-up test: 0/3   GAIT: Assistive device utilized: None Comments: WNL  POSTURE: rounded shoulders, forward head, decreased thoracic kyphosis, and elevated Rt iliac crest, Lt posterior rotation   LUMBARAROM/PROM:  A/PROM A/PROM  Eval (% available)  Flexion 50  Extension 100  Right lateral flexion 100  Left lateral flexion 100  Right rotation 100  Left  rotation 100   (Blank rows = not tested)  PALPATION: General: WNL  Abdominal: Sternocostal angle: increased Breathing: apical Tenderness: none Scar tissue: cholecystectomy scar Diastasis: WNL                External Perineal Exam: WNL                             Internal Pelvic Floor: WNL  Patient confirms identification and approves PT to assess internal pelvic floor and treatment Yes  PELVIC MMT:   MMT eval  Vaginal 1-2/5 (inconsistent, 1 second hold, 9 poorly coordinated repetitions  (Blank rows = not tested)        TONE: WNL  PROLAPSE: Grade 1 anterior vaginal wall laxity   TODAY'S TREATMENT:                                                                                                                              DATE:  10/28/2023 Neuromuscular re-education: Transversus abdominus training with multimodal cues for improved motor control and breath coordination Bil supine UE ball press with transversus abdominus and pelvic floor muscle contractions and breath coordination 10x Supine hip adduction ball press with transversus abdominus and pelvic floor muscle contractions and breath coordination 10x Bridge with hip adduction, transversus abdominus, and pelvic floor muscle 2 x 10 Supine resisted hip abduction + red band 2 x 10 Supine resisted march + red band 2 x 10 Seated hip adduction ball press with transversus abdominus and pelvic floor muscle 2 x 10 Seated hip abduction red band with transversus abdominus and pelvic floor muscle 2 x 10 Seated resisted march  red band with transversus abdominus and pelvic floor muscle 2 x 10    09/29/24 EVAL  Neuromuscular re-education: Pt provides verbal consent for internal vaginal/rectal pelvic floor exam. Internal vaginal pelvic floor muscle contraction training Quick flicks Long holds Urge drill  Therapeutic activities: Bladder journal  Double voiding     PATIENT EDUCATION:  Education details: See above Person  educated: Patient Education method: Explanation, Demonstration, Tactile cues, Verbal cues, and Handouts Education comprehension: verbalized understanding  HOME EXERCISE PROGRAM: [id number]  ASSESSMENT:  CLINICAL IMPRESSION: Patient is a 29 y.o. female who was seen today for physical therapy evaluation and treatment for urinary frequency and urge incontinence. Pt has been seeing progress with bladder control, but does still notice increased frequency and urgency. We performed core training while incorporating pelvic floor muscles and breathing today with good tolerance and form. She was able to progress into more challenging exercises and HEP updated.  Pt will continue to benefit from skilled PT intervention in order to decrease urinary frequency/urge incontinence, address impairments, and improve quality of life.    OBJECTIVE IMPAIRMENTS: decreased activity tolerance, decreased coordination, decreased endurance, decreased mobility, decreased ROM, decreased strength, increased fascial restrictions, increased muscle spasms, impaired flexibility, impaired tone, improper body mechanics, postural dysfunction, and pain.   ACTIVITY LIMITATIONS: continence  PARTICIPATION LIMITATIONS: cleaning, laundry, driving, community activity, and occupation  PERSONAL FACTORS: 1 comorbidity: medical history are also affecting patient's functional outcome.   REHAB POTENTIAL: Good  CLINICAL DECISION MAKING: Stable/uncomplicated  EVALUATION COMPLEXITY: Low   GOALS: Goals reviewed with patient? Yes  SHORT TERM GOALS: Target date: 10/27/2024   Pt will be independent with HEP in order to improve activity tolerance.   Baseline: Goal status: Met 10/27/2024  2.  Pt will be independent with the knack, urge suppression technique, and double voiding in order to improve bladder habits and decrease urinary incontinence.    Baseline:  Goal status: MET 172026  3.  Pt will report no more than 1 trip to the bathroom  in an hour in order to decrease stops with drive to [place].   Baseline: up to every 10 minutes  Goal status: IN PROGRESS 10/27/2024  4.  Patient will report 25% improvement in urinary incontinence in order to decrease pad use, risk of infection, and quality of life.   Baseline:  Goal status: IN PROGRESS 10/27/2024     LONG TERM GOALS: Target date: 03/16/2025  Pt will be independent with advanced HEP in order to improve activity tolerance.   Baseline:  Goal status: IN PROGRESS 10/27/2024  2.  Patient will report 75% improvement in urinary incontinence in order to decrease pad use, decrease risk of infection, and improve quality of life.   Baseline:  Goal status: IN PROGRESS 10/27/2024  3.  Pt will be able to go 2-3 hours in between voids without urgency or incontinence in order to improve QOL and perform all functional activities with less difficulty.   Baseline:  Goal status: IN PROGRESS 10/27/2024  4.  Pt will demonstrate normal pelvic floor muscle tone and A/ROM, able to achieve 3/5 strength with contractions and 10 sec endurance, in order to reduce urinary leaking and number of pads patient wears.   Baseline:  Goal status: IN PROGRESS 10/27/2024  5.  Pt will improve UIQ-7 score to less than 25 in order to demonstrate decreased functional impairment due to urinary frequency and incontinence.  Baseline:   Goal status: IN PROGRESS 10/27/2024   PLAN:  PT FREQUENCY: 1-2x/week  PT DURATION:  10 visits    PLANNED INTERVENTIONS: 97164- PT Re-evaluation, 97110-Therapeutic exercises, 97530- Therapeutic activity, 97112- Neuromuscular re-education, [phone]- Self Care, 02859- Manual therapy, [phone]- Gait training, [phone]- Aquatic Therapy, [phone]- Electrical stimulation (unattended), [phone]- Traction (mechanical), [id number]- Ionotophoresis 4mg /ml Dexamethasone , 20560 (1-2 muscles), 20561 (3+ muscles)- Dry Needling, Patient/Family education, Balance training, Taping, Joint mobilization, Joint  manipulation, Spinal manipulation, Spinal mobilization, Scar mobilization, Vestibular training, Cryotherapy, Moist heat, and Biofeedback  PLAN FOR NEXT SESSION: core strengthening, bladder habits, review bladder journal   Josette Mares, PT, DPT1/7/20268:08 AM [facility] [address] [place], [place] [address] Phone # [phone] Fax [phone]   "

## 2024-11-05 NOTE — Progress Notes (Signed)
 "  29 y.o. G0P0000 Single Hispanic female here for annual exam.    Needs refill of birth control. Is not having headaches on her low dose pills.    Travels a lot at time and has forgotten her pills and has been using multiple packs to fill in the gaps.   Desires STD screening.    Had normal AIC about 4 months ago.  FH DM.   Patient wants to have routine blood work done also.  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   PCP: Allwardt, Mardy HERO, PA-C   Patient's last menstrual period was 10/26/2024 (exact date).     Period Duration (Days): 4-5 Period Pattern: (!) Irregular Menstrual Flow: Moderate Menstrual Control: Tampon, Maxi pad Dysmenorrhea: (!) Mild Dysmenorrhea Symptoms: Cramping     Sexually active: Yes.    The current method of family planning is OCP (estrogen/progesterone) and condoms.    Menopausal hormone therapy:  n/a Exercising: Yes.    Gym sometimes Smoker:  Former   OB History  Gravida Para Term Preterm AB Living  0 0 0 0 0 0  SAB IAB Ectopic Multiple Live Births  0 0 0 0 0     HEALTH MAINTENANCE: Last 2 paps:  03/02/24 LSIL, HR HPV +, 11/03/23 LSIL, HR HPV + History of abnormal Pap or positive HPV:  Yes, Colposcopy 03/02/24 low grade dysplasia ECC normal, Colposcopy 10/29/22 - LGSIL on bx and ECC benign.  Mammogram:   n/a Colonoscopy:  n/a Bone Density:  n/a  Result  n/a   Immunization History  Administered Date(s) Administered   DTP / HiB 07/11/1995, 10/03/1995, 11/21/1995   DTaP 07/11/1995, 10/03/1995, 11/21/1995   HIB (PRP-OMP) 07/11/1995, 10/03/1995, 11/21/1995   HPV 9-valent 10/03/2022, 11/03/2023, 03/02/2024   Hep B, Unspecified Dec 24, 1994, 07/11/1995, 05/21/1996   Hepatitis B July 07, 1995, 07/11/1995, 05/21/1996   Hpv-Unspecified 10/03/2022   IPV 07/11/1995, 10/03/1995, 11/21/1995   Influenza, Quadrivalent, Recombinant, Inj, Pf 07/22/2019   Influenza,inj,Quad PF,6+ Mos 07/23/2016, 07/18/2020, 08/06/2022   Influenza-Unspecified 07/18/2020, 07/11/2021,  08/06/2022, 07/26/2023   MMR 05/21/1996   OPV 07/11/1995, 10/03/1995, 11/21/1995   PFIZER(Purple Top)SARS-COV-2 Vaccination 11/15/2019, 12/10/2019   PPD Test 04/12/2021   Tdap 05/24/2013, 04/27/2018   Varicella 05/21/1996      reports that she quit smoking about 7 years ago. Her smoking use included cigarettes. She has never used smokeless tobacco. She reports current alcohol use. She reports that she does not use drugs.  History reviewed. No pertinent past medical history.  Past Surgical History:  Procedure Laterality Date   CHOLECYSTECTOMY N/A 12/24/2021   Procedure: LAPAROSCOPIC CHOLECYSTECTOMY;  Surgeon: Ebbie Cough, MD;  Location: [facility];  Service: General;  Laterality: N/A;    Current Outpatient Medications  Medication Sig Dispense Refill   cholecalciferol (VITAMIN D3) 25 MCG (1000 UNIT) tablet Take 1,000 Units by mouth daily.     loratadine (CLARITIN) 10 MG tablet Take 10 mg by mouth daily as needed for allergies.     naphazoline-glycerin (CLEAR EYES REDNESS) 0.012-0.2 % SOLN 1-2 drops 4 (four) times daily as needed for eye irritation.     norethindrone -ethinyl estradiol (LOESTRIN) 1-20 MG-MCG tablet Take 1 tablet by mouth daily. 84 tablet 2   Vitamin D , Ergocalciferol , (DRISDOL ) 1.25 MG (50000 UNIT) CAPS capsule One capsule by mouth once a week for 8 weeks. Then take 1000IU/day 8 capsule 0   No current facility-administered medications for this visit.    ALLERGIES: Patient has no known allergies.  Family History  Problem Relation Age of Onset  Arthritis Mother    Diabetes Father    Hyperlipidemia Father    Hypertension Maternal Grandmother    Arthritis Maternal Grandmother    Diabetes Maternal Grandfather    Stroke Maternal Grandfather    Seizures Maternal Grandfather    Heart attack Maternal Grandfather    Early death Paternal Grandfather     Review of Systems  All other systems reviewed and are negative.   PHYSICAL EXAM:  BP 110/64 (BP Location: Left  Arm, Patient Position: Sitting)   Pulse 65   Ht 5' (1.524 m)   Wt 149 lb (67.6 kg)   LMP 10/26/2024 (Exact Date)   SpO2 100%   BMI 29.10 kg/m     General appearance: alert, cooperative and appears stated age Head: normocephalic, without obvious abnormality, atraumatic Neck: no adenopathy, supple, symmetrical, trachea midline and thyroid  normal to inspection and palpation Lungs: clear to auscultation bilaterally Breasts: normal appearance, no masses or tenderness, No nipple retraction or dimpling, No nipple discharge or bleeding, No axillary adenopathy Heart: regular rate and rhythm Abdomen: soft, non-tender; no masses, no organomegaly Extremities: extremities normal, atraumatic, no cyanosis or edema Skin: skin color, texture, turgor normal. No rashes or lesions Lymph nodes: cervical, supraclavicular, and axillary nodes normal. Neurologic: grossly normal  Pelvic: External genitalia:  no lesions              No abnormal inguinal nodes palpated.              Urethra:  normal appearing urethra with no masses, tenderness or lesions              Bartholins and Skenes: normal                 Vagina: normal appearing vagina with normal color and discharge, no lesions              Cervix: no lesions              Pap taken: yes Bimanual Exam:  Uterus:  normal size, contour, position, consistency, mobility, non-tender              Adnexa: no mass, fullness, tenderness    Chaperone was present for exam:  Clotilda DEL, CMA  ASSESSMENT: Well woman visit with gynecologic exam. 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  STD screening.  Routine labs.  PHQ-2-9: 0  PLAN: Mammogram screening discussed. Self breast awareness reviewed. Pap and HRV collected:  yes Guidelines for Calcium, Vitamin D , regular exercise program including cardiovascular and weight bearing exercise. Medication refills:  Lo-Estrin 1/20 3 packs, 3 refills.   We discussed other contraceptive options:  patch, ring, Nexplanon, IUDs.  She  will continue her low dose COCs.  STD screening and chol, CMP, CBC.  Follow up:  yearly and prn.            "

## 2024-11-07 ENCOUNTER — Encounter: Payer: Self-pay | Admitting: Obstetrics and Gynecology

## 2024-11-08 ENCOUNTER — Encounter: Payer: Self-pay | Admitting: Obstetrics and Gynecology

## 2024-11-08 ENCOUNTER — Ambulatory Visit (INDEPENDENT_AMBULATORY_CARE_PROVIDER_SITE_OTHER): Admitting: Obstetrics and Gynecology

## 2024-11-08 ENCOUNTER — Other Ambulatory Visit (HOSPITAL_COMMUNITY)
Admission: RE | Admit: 2024-11-08 | Discharge: 2024-11-08 | Disposition: A | Source: Ambulatory Visit | Attending: Obstetrics and Gynecology | Admitting: Obstetrics and Gynecology

## 2024-11-08 VITALS — BP 110/64 | HR 65 | Ht 60.0 in | Wt 149.0 lb

## 2024-11-08 DIAGNOSIS — Z124 Encounter for screening for malignant neoplasm of cervix: Secondary | ICD-10-CM | POA: Insufficient documentation

## 2024-11-08 DIAGNOSIS — Z01419 Encounter for gynecological examination (general) (routine) without abnormal findings: Secondary | ICD-10-CM

## 2024-11-08 DIAGNOSIS — Z0000 Encounter for general adult medical examination without abnormal findings: Secondary | ICD-10-CM

## 2024-11-08 DIAGNOSIS — Z113 Encounter for screening for infections with a predominantly sexual mode of transmission: Secondary | ICD-10-CM

## 2024-11-08 DIAGNOSIS — Z1331 Encounter for screening for depression: Secondary | ICD-10-CM

## 2024-11-08 DIAGNOSIS — Z1159 Encounter for screening for other viral diseases: Secondary | ICD-10-CM

## 2024-11-08 DIAGNOSIS — Z114 Encounter for screening for human immunodeficiency virus [HIV]: Secondary | ICD-10-CM

## 2024-11-08 DIAGNOSIS — N870 Mild cervical dysplasia: Secondary | ICD-10-CM | POA: Diagnosis not present

## 2024-11-08 MED ORDER — NORETHINDRONE ACET-ETHINYL EST 1-20 MG-MCG PO TABS: 1.0000 | ORAL_TABLET | Freq: Every day | ORAL | 3 refills | Status: AC

## 2024-11-08 NOTE — Patient Instructions (Signed)

## 2024-11-09 LAB — LIPID PANEL
Cholesterol: 175 mg/dL
HDL: 47 mg/dL — ABNORMAL LOW
LDL Cholesterol (Calc): 109 mg/dL — ABNORMAL HIGH
Non-HDL Cholesterol (Calc): 128 mg/dL
Total CHOL/HDL Ratio: 3.7 (calc)
Triglycerides: 95 mg/dL

## 2024-11-09 LAB — COMPREHENSIVE METABOLIC PANEL WITH GFR
AG Ratio: 1.8 (calc) (ref 1.0–2.5)
ALT: 19 U/L (ref 6–29)
AST: 16 U/L (ref 10–30)
Albumin: 4.4 g/dL (ref 3.6–5.1)
Alkaline phosphatase (APISO): 46 U/L (ref 31–125)
BUN: 10 mg/dL (ref 7–25)
CO2: 26 mmol/L (ref 20–32)
Calcium: 9.2 mg/dL (ref 8.6–10.2)
Chloride: 106 mmol/L (ref 98–110)
Creat: 0.66 mg/dL (ref 0.50–0.96)
Globulin: 2.5 g/dL (ref 1.9–3.7)
Glucose, Bld: 101 mg/dL — ABNORMAL HIGH (ref 65–99)
Potassium: 4.3 mmol/L (ref 3.5–5.3)
Sodium: 143 mmol/L (ref 135–146)
Total Bilirubin: 0.6 mg/dL (ref 0.2–1.2)
Total Protein: 6.9 g/dL (ref 6.1–8.1)
eGFR: 122 mL/min/1.73m2

## 2024-11-09 LAB — CYTOLOGY - PAP
Chlamydia: NEGATIVE
Comment: NEGATIVE
Comment: NEGATIVE
Comment: NEGATIVE
Comment: NORMAL
High risk HPV: POSITIVE — AB
Neisseria Gonorrhea: NEGATIVE
Trichomonas: NEGATIVE

## 2024-11-09 LAB — CBC
HCT: 40.3 % (ref 35.9–46.0)
Hemoglobin: 13.4 g/dL (ref 11.7–15.5)
MCH: 30.3 pg (ref 27.0–33.0)
MCHC: 33.3 g/dL (ref 31.6–35.4)
MCV: 91.2 fL (ref 81.4–101.7)
MPV: 11.1 fL (ref 7.5–12.5)
Platelets: 346 Thousand/uL (ref 140–400)
RBC: 4.42 Million/uL (ref 3.80–5.10)
RDW: 11.8 % (ref 11.0–15.0)
WBC: 9.5 Thousand/uL (ref 3.8–10.8)

## 2024-11-09 LAB — HEPATITIS C ANTIBODY: Hepatitis C Ab: NONREACTIVE

## 2024-11-09 LAB — HIV ANTIBODY (ROUTINE TESTING W REFLEX)
HIV 1&2 Ab, 4th Generation: NONREACTIVE
HIV FINAL INTERPRETATION: NEGATIVE

## 2024-11-09 LAB — SYPHILIS: RPR W/REFLEX TO RPR TITER AND TREPONEMAL ANTIBODIES, TRADITIONAL SCREENING AND DIAGNOSIS ALGORITHM: RPR Ser Ql: NONREACTIVE

## 2024-11-11 ENCOUNTER — Ambulatory Visit: Payer: Self-pay | Admitting: Obstetrics and Gynecology

## 2024-11-11 DIAGNOSIS — R8789 Other abnormal findings in specimens from female genital organs: Secondary | ICD-10-CM

## 2024-11-30 ENCOUNTER — Ambulatory Visit

## 2025-01-11 ENCOUNTER — Encounter: Admitting: Obstetrics and Gynecology

## 2025-11-16 ENCOUNTER — Ambulatory Visit: Admitting: Obstetrics and Gynecology
